# Patient Record
Sex: Female | Born: 1965
Health system: Southern US, Community
[De-identification: ages and names within clinical notes are randomized; demographics above are authoritative.]

## PROBLEM LIST (undated history)

## (undated) DIAGNOSIS — Z9109 Other allergy status, other than to drugs and biological substances: Secondary | ICD-10-CM

## (undated) DIAGNOSIS — M722 Plantar fascial fibromatosis: Secondary | ICD-10-CM

## (undated) DIAGNOSIS — T7840XA Allergy, unspecified, initial encounter: Secondary | ICD-10-CM

## (undated) DIAGNOSIS — J45909 Unspecified asthma, uncomplicated: Secondary | ICD-10-CM

## (undated) DIAGNOSIS — J302 Other seasonal allergic rhinitis: Secondary | ICD-10-CM

## (undated) HISTORY — PX: FOOT SURGERY: SHX648

## (undated) HISTORY — DX: Plantar fascial fibromatosis: M72.2

## (undated) HISTORY — DX: Allergy, unspecified, initial encounter: T78.40XA

## (undated) HISTORY — DX: Other seasonal allergic rhinitis: J30.2

## (undated) HISTORY — DX: Other allergy status, other than to drugs and biological substances: Z91.09

## (undated) HISTORY — PX: TONSILLECTOMY: SHX5217

## (undated) HISTORY — DX: Unspecified asthma, uncomplicated: J45.909

---

## 2011-04-27 ENCOUNTER — Emergency Department (HOSPITAL_COMMUNITY)
Admission: EM | Admit: 2011-04-27 | Discharge: 2011-04-28 | Disposition: A | Discharge status: Home / Self Care | Payer: Self-pay | Attending: Emergency Medicine | Admitting: Emergency Medicine

## 2011-04-27 DIAGNOSIS — J45909 Unspecified asthma, uncomplicated: Secondary | ICD-10-CM | POA: Insufficient documentation

## 2011-04-27 DIAGNOSIS — J329 Chronic sinusitis, unspecified: Secondary | ICD-10-CM | POA: Insufficient documentation

## 2011-04-27 DIAGNOSIS — J3489 Other specified disorders of nose and nasal sinuses: Secondary | ICD-10-CM | POA: Insufficient documentation

## 2011-05-10 ENCOUNTER — Inpatient Hospital Stay (INDEPENDENT_AMBULATORY_CARE_PROVIDER_SITE_OTHER)
Admission: RE | Admit: 2011-05-10 | Discharge: 2011-05-10 | Disposition: A | Discharge status: Home / Self Care | Payer: Self-pay | Source: Ambulatory Visit | Attending: Family Medicine | Admitting: Family Medicine

## 2011-05-10 DIAGNOSIS — J310 Chronic rhinitis: Secondary | ICD-10-CM

## 2011-05-10 DIAGNOSIS — J019 Acute sinusitis, unspecified: Secondary | ICD-10-CM

## 2013-12-10 LAB — HM PAP SMEAR: HM PAP: POSITIVE

## 2015-01-20 ENCOUNTER — Ambulatory Visit: Payer: 59

## 2015-02-25 ENCOUNTER — Telehealth: Payer: Self-pay | Admitting: *Deleted

## 2015-02-25 ENCOUNTER — Encounter: Payer: Self-pay | Admitting: *Deleted

## 2015-02-25 NOTE — Telephone Encounter (Signed)
Pre-Visit Call completed with patient and chart updated.   Pre-Visit Info documented in Specialty Comments under [employer].    

## 2015-02-25 NOTE — Telephone Encounter (Signed)
Patient returned phone call. °

## 2015-02-25 NOTE — Addendum Note (Signed)
Addended by: Noreene LarssonLARSON, Kyren Vaux A on: 02/25/2015 04:53 PM   Modules accepted: Medications

## 2015-02-25 NOTE — Telephone Encounter (Signed)
Unable to reach patient at time of Pre-Visit Call.  Left message for patient to return call when available.    

## 2015-02-26 ENCOUNTER — Ambulatory Visit (INDEPENDENT_AMBULATORY_CARE_PROVIDER_SITE_OTHER): Payer: 59 | Admitting: Physician Assistant

## 2015-02-26 ENCOUNTER — Encounter: Payer: Self-pay | Admitting: Physician Assistant

## 2015-02-26 VITALS — BP 116/57 | HR 54 | Temp 98.2°F | Resp 16 | Ht 60.0 in | Wt 127.4 lb

## 2015-02-26 DIAGNOSIS — M722 Plantar fascial fibromatosis: Secondary | ICD-10-CM | POA: Diagnosis not present

## 2015-02-26 DIAGNOSIS — J302 Other seasonal allergic rhinitis: Secondary | ICD-10-CM | POA: Diagnosis not present

## 2015-02-26 DIAGNOSIS — J4530 Mild persistent asthma, uncomplicated: Secondary | ICD-10-CM | POA: Diagnosis not present

## 2015-02-26 MED ORDER — MELOXICAM 15 MG PO TABS: 15.0000 mg | ORAL_TABLET | Freq: Every day | ORAL | Status: DC

## 2015-02-26 MED ORDER — BUDESONIDE-FORMOTEROL FUMARATE 80-4.5 MCG/ACT IN AERO: 2.0000 | INHALATION_SPRAY | Freq: Two times a day (BID) | RESPIRATORY_TRACT | Status: DC

## 2015-02-26 MED ORDER — AZELASTINE-FLUTICASONE 137-50 MCG/ACT NA SUSP: NASAL | Status: DC

## 2015-02-26 MED ORDER — MONTELUKAST SODIUM 10 MG PO TABS: 10.0000 mg | ORAL_TABLET | Freq: Every day | ORAL | Status: DC

## 2015-02-26 MED ORDER — ALBUTEROL SULFATE HFA 108 (90 BASE) MCG/ACT IN AERS: 2.0000 | INHALATION_SPRAY | Freq: Four times a day (QID) | RESPIRATORY_TRACT | Status: DC | PRN

## 2015-02-26 NOTE — Assessment & Plan Note (Signed)
Rx Singulair and Dymista.  Supportive measures discussed with patient.  Follow-up 1 month.

## 2015-02-26 NOTE — Progress Notes (Signed)
Pre visit review using our clinic review tool, if applicable. No additional management support is needed unless otherwise documented below in the visit note/SLS  

## 2015-02-26 NOTE — Assessment & Plan Note (Signed)
Referral placed to podiatry per patient request.  Agree this is best giving chronicity of symptoms. Rx Mobic with food. Supportive footwear.  Supportive measures reviewed.

## 2015-02-26 NOTE — Assessment & Plan Note (Signed)
Refilled Symbicort and Albuterol.  Will restart Singulair. Follow-up 1 month.

## 2015-02-26 NOTE — Patient Instructions (Signed)
Please continue your Symbicort and Albuterol as directed. Restart Singulair as directed. Stop the Flonase and start the Dymista nasal spray.  You will be contacted by Podiatry for assessment. Please use the Mobic daily with food. Wear supportive shoes.

## 2015-02-26 NOTE — Progress Notes (Signed)
Patient presents to clinic today to establish care.  Asthma -- Patient currently on Symbicort and Albuterol.  Is out of albuterol and endorses increased wheeze due to her allergies.  Denies chest pain, lightheadedness or dizziness.  Was previously on Singulair which she notes was very helpful.  Seasonal/Environmental Allergies -- Currently on Zyrtec and Flonase with little relief of symptoms.  Endorses rhinorrhea and watery eyes along with sneezing and chronic PND.  Plantar Fasciitis -- Long-standing history with prior surgery.  Endorses recent flare up with L foot worse than R.  Is requesting referral to podiatry.  Past Medical History  Diagnosis Date  . Asthma   . Allergy   . Environmental allergies   . Seasonal allergies   . Plantar fasciitis     Bilateral    Past Surgical History  Procedure Laterality Date  . Tonsillectomy    . Cesarean section    . Foot surgery      Right    Current Outpatient Prescriptions on File Prior to Visit  Medication Sig Dispense Refill  . cetirizine (ZYRTEC) 10 MG tablet Take 10 mg by mouth daily as needed.      No current facility-administered medications on file prior to visit.    No Known Allergies  Family History  Problem Relation Age of Onset  . Stroke Father 6089    Deceased  . Other Father     Borderline Diabetes  . Macular degeneration Mother     Living  . Vascular Disease Mother   . Lung cancer Maternal Uncle     #1  . Heart attack Maternal Uncle     Deceased at 2842, #2  . Skin cancer Maternal Uncle     #1  . HIV/AIDS Brother     #1  . Healthy Brother     #2  . Healthy Sister     x2  . Heart defect Son     Bicuspid Valve  . Healthy Son     #2  . Healthy Daughter     x1  . Allergies Son     #1    History   Social History  . Marital Status: Married    Spouse Name: N/A  . Number of Children: N/A  . Years of Education: N/A   Occupational History  . Not on file.   Social History Main Topics  . Smoking  status: Current Some Day Smoker -- 1.00 packs/day    Types: Cigarettes  . Smokeless tobacco: Never Used  . Alcohol Use: 4.8 oz/week    8 Standard drinks or equivalent per week  . Drug Use: No  . Sexual Activity: Yes   Other Topics Concern  . Not on file   Social History Narrative   Review of Systems  Constitutional: Negative for fever and weight loss.  Respiratory: Positive for wheezing. Negative for cough, hemoptysis, sputum production and shortness of breath.   Cardiovascular: Negative for chest pain and palpitations.  Musculoskeletal: Positive for joint pain.   BP 116/57 mmHg  Pulse 54  Temp(Src) 98.2 F (36.8 C) (Oral)  Resp 16  Ht 5' (1.524 m)  Wt 127 lb 6 oz (57.777 kg)  BMI 24.88 kg/m2  SpO2 97%  Physical Exam  Constitutional: She is oriented to person, place, and time and well-developed, well-nourished, and in no distress.  HENT:  Head: Normocephalic and atraumatic.  Right Ear: Tympanic membrane, external ear and ear canal normal.  Left Ear: Tympanic membrane, external ear and ear  canal normal.  Nose: Mucosal edema and rhinorrhea present.  Mouth/Throat: Uvula is midline, oropharynx is clear and moist and mucous membranes are normal.  Eyes: Conjunctivae are normal.  Neck: Neck supple.  Cardiovascular: Normal rate, regular rhythm, normal heart sounds and intact distal pulses.   Pulmonary/Chest: Effort normal and breath sounds normal. No respiratory distress. She has no wheezes. She has no rales. She exhibits no tenderness.  Lymphadenopathy:    She has no cervical adenopathy.  Neurological: She is alert and oriented to person, place, and time.  Skin: Skin is warm and dry. No rash noted.  Psychiatric: Affect normal.  Vitals reviewed.  Assessment/Plan: Seasonal allergies Rx Singulair and Dymista.  Supportive measures discussed with patient.  Follow-up 1 month.  Asthma, mild persistent Refilled Symbicort and Albuterol.  Will restart Singulair. Follow-up 1  month.  Plantar fasciitis, bilateral Referral placed to podiatry per patient request.  Agree this is best giving chronicity of symptoms. Rx Mobic with food. Supportive footwear.  Supportive measures reviewed.

## 2015-07-04 ENCOUNTER — Ambulatory Visit (INDEPENDENT_AMBULATORY_CARE_PROVIDER_SITE_OTHER): Payer: 59 | Admitting: Physician Assistant

## 2015-07-04 ENCOUNTER — Encounter: Payer: Self-pay | Admitting: Physician Assistant

## 2015-07-04 VITALS — BP 123/78 | HR 58 | Temp 98.1°F | Resp 16 | Ht 60.0 in | Wt 130.5 lb

## 2015-07-04 DIAGNOSIS — J4530 Mild persistent asthma, uncomplicated: Secondary | ICD-10-CM

## 2015-07-04 DIAGNOSIS — J019 Acute sinusitis, unspecified: Secondary | ICD-10-CM

## 2015-07-04 DIAGNOSIS — J302 Other seasonal allergic rhinitis: Secondary | ICD-10-CM | POA: Diagnosis not present

## 2015-07-04 DIAGNOSIS — M722 Plantar fascial fibromatosis: Secondary | ICD-10-CM | POA: Diagnosis not present

## 2015-07-04 DIAGNOSIS — B9689 Other specified bacterial agents as the cause of diseases classified elsewhere: Secondary | ICD-10-CM | POA: Insufficient documentation

## 2015-07-04 MED ORDER — AZELASTINE-FLUTICASONE 137-50 MCG/ACT NA SUSP: NASAL | Status: DC

## 2015-07-04 MED ORDER — BUDESONIDE-FORMOTEROL FUMARATE 80-4.5 MCG/ACT IN AERO: 2.0000 | INHALATION_SPRAY | Freq: Two times a day (BID) | RESPIRATORY_TRACT | Status: DC

## 2015-07-04 MED ORDER — AMOXICILLIN-POT CLAVULANATE 875-125 MG PO TABS: 1.0000 | ORAL_TABLET | Freq: Two times a day (BID) | ORAL | Status: DC

## 2015-07-04 MED ORDER — FEXOFENADINE HCL 180 MG PO TABS: 180.0000 mg | ORAL_TABLET | Freq: Every day | ORAL | Status: DC

## 2015-07-04 MED ORDER — ALBUTEROL SULFATE (2.5 MG/3ML) 0.083% IN NEBU: 2.5000 mg | INHALATION_SOLUTION | Freq: Four times a day (QID) | RESPIRATORY_TRACT | Status: DC | PRN

## 2015-07-04 MED ORDER — MELOXICAM 15 MG PO TABS: 15.0000 mg | ORAL_TABLET | Freq: Every day | ORAL | Status: DC

## 2015-07-04 MED ORDER — MONTELUKAST SODIUM 10 MG PO TABS: 10.0000 mg | ORAL_TABLET | Freq: Every day | ORAL | Status: DC

## 2015-07-04 NOTE — Progress Notes (Signed)
Patient presents to clinic today c/o feeling under the weather over the past couple of weeks. Endorses sinus pressure/sinus pain, tooth pain and fatigue. Endorses some mild PND and ST over the past week. Endorses chest tightness and wheezing 2 days ago but has resolved since that time. Is in need of refills of asthma medications. Denies recent travel or sick contact.  Past Medical History  Diagnosis Date  . Asthma   . Allergy   . Environmental allergies   . Seasonal allergies   . Plantar fasciitis     Bilateral    Current Outpatient Prescriptions on File Prior to Visit  Medication Sig Dispense Refill  . albuterol (PROVENTIL HFA;VENTOLIN HFA) 108 (90 BASE) MCG/ACT inhaler Inhale 2 puffs into the lungs every 6 (six) hours as needed for wheezing or shortness of breath. 6.7 g 3   No current facility-administered medications on file prior to visit.    No Known Allergies  Family History  Problem Relation Age of Onset  . Stroke Father 74    Deceased  . Other Father     Borderline Diabetes  . Macular degeneration Mother     Living  . Vascular Disease Mother   . Lung cancer Maternal Uncle     #1  . Heart attack Maternal Uncle     Deceased at 53, #2  . Skin cancer Maternal Uncle     #1  . HIV/AIDS Brother     #1  . Healthy Brother     #2  . Healthy Sister     x2  . Heart defect Son     Bicuspid Valve  . Healthy Son     #2  . Healthy Daughter     x1  . Allergies Son     #1    Social History   Social History  . Marital Status: Married    Spouse Name: N/A  . Number of Children: N/A  . Years of Education: N/A   Social History Main Topics  . Smoking status: Current Some Day Smoker -- 1.00 packs/day    Types: Cigarettes  . Smokeless tobacco: Never Used  . Alcohol Use: 4.8 oz/week    8 Standard drinks or equivalent per week  . Drug Use: No  . Sexual Activity: Yes   Other Topics Concern  . None   Social History Narrative   Review of Systems - See HPI.  All  other ROS are negative.  BP 123/78 mmHg  Pulse 58  Temp(Src) 98.1 F (36.7 C) (Oral)  Resp 16  Ht 5' (1.524 m)  Wt 130 lb 8 oz (59.194 kg)  BMI 25.49 kg/m2  SpO2 96%  Physical Exam  Constitutional: She is well-developed, well-nourished, and in no distress.  HENT:  Head: Normocephalic and atraumatic.  Right Ear: Tympanic membrane normal.  Left Ear: Tympanic membrane normal.  Nose: Right sinus exhibits maxillary sinus tenderness. Left sinus exhibits maxillary sinus tenderness.  Mouth/Throat: Uvula is midline, oropharynx is clear and moist and mucous membranes are normal.  Eyes: Conjunctivae are normal.  Neck: Neck supple.  Cardiovascular: Normal rate, regular rhythm, normal heart sounds and intact distal pulses.   Pulmonary/Chest: Effort normal and breath sounds normal. No respiratory distress. She has no wheezes. She has no rales. She exhibits no tenderness.  Neurological: She is alert.  Skin: Skin is warm and dry. No rash noted.  Psychiatric: Affect normal.  Vitals reviewed.   No results found for this or any previous visit (from the  past 2160 hour(s)).  Assessment/Plan: Acute bacterial sinusitis Rx Augmentin.  Increase fluids.  Rest.  Saline nasal spray.  Probiotic.  Mucinex as directed.  Humidifier in bedroom. Allergy/asthma medications refilled.  Call or return to clinic if symptoms are not improving.

## 2015-07-04 NOTE — Progress Notes (Signed)
Pre visit review using our clinic review tool, if applicable. No additional management support is needed unless otherwise documented below in the visit note/SLS  

## 2015-07-04 NOTE — Patient Instructions (Signed)
Please take antibiotic as directed.  Increase fluid intake.  Use Saline nasal spray.  Take a daily multivitamin. Please continue allergy medications.  Place a humidifier in the bedroom.  Please call or return clinic if symptoms are not improving.  Sinusitis Sinusitis is redness, soreness, and swelling (inflammation) of the paranasal sinuses. Paranasal sinuses are air pockets within the bones of your face (beneath the eyes, the middle of the forehead, or above the eyes). In healthy paranasal sinuses, mucus is able to drain out, and air is able to circulate through them by way of your nose. However, when your paranasal sinuses are inflamed, mucus and air can become trapped. This can allow bacteria and other germs to grow and cause infection. Sinusitis can develop quickly and last only a short time (acute) or continue over a long period (chronic). Sinusitis that lasts for more than 12 weeks is considered chronic.  CAUSES  Causes of sinusitis include:  Allergies.  Structural abnormalities, such as displacement of the cartilage that separates your nostrils (deviated septum), which can decrease the air flow through your nose and sinuses and affect sinus drainage.  Functional abnormalities, such as when the small hairs (cilia) that line your sinuses and help remove mucus do not work properly or are not present. SYMPTOMS  Symptoms of acute and chronic sinusitis are the same. The primary symptoms are pain and pressure around the affected sinuses. Other symptoms include:  Upper toothache.  Earache.  Headache.  Bad breath.  Decreased sense of smell and taste.  A cough, which worsens when you are lying flat.  Fatigue.  Fever.  Thick drainage from your nose, which often is green and may contain pus (purulent).  Swelling and warmth over the affected sinuses. DIAGNOSIS  Your caregiver will perform a physical exam. During the exam, your caregiver may:  Look in your nose for signs of abnormal  growths in your nostrils (nasal polyps).  Tap over the affected sinus to check for signs of infection.  View the inside of your sinuses (endoscopy) with a special imaging device with a light attached (endoscope), which is inserted into your sinuses. If your caregiver suspects that you have chronic sinusitis, one or more of the following tests may be recommended:  Allergy tests.  Nasal culture A sample of mucus is taken from your nose and sent to a lab and screened for bacteria.  Nasal cytology A sample of mucus is taken from your nose and examined by your caregiver to determine if your sinusitis is related to an allergy. TREATMENT  Most cases of acute sinusitis are related to a viral infection and will resolve on their own within 10 days. Sometimes medicines are prescribed to help relieve symptoms (pain medicine, decongestants, nasal steroid sprays, or saline sprays).  However, for sinusitis related to a bacterial infection, your caregiver will prescribe antibiotic medicines. These are medicines that will help kill the bacteria causing the infection.  Rarely, sinusitis is caused by a fungal infection. In theses cases, your caregiver will prescribe antifungal medicine. For some cases of chronic sinusitis, surgery is needed. Generally, these are cases in which sinusitis recurs more than 3 times per year, despite other treatments. HOME CARE INSTRUCTIONS   Drink plenty of water. Water helps thin the mucus so your sinuses can drain more easily.  Use a humidifier.  Inhale steam 3 to 4 times a day (for example, sit in the bathroom with the shower running).  Apply a warm, moist washcloth to your face 3 to  4 times a day, or as directed by your caregiver.  Use saline nasal sprays to help moisten and clean your sinuses.  Take over-the-counter or prescription medicines for pain, discomfort, or fever only as directed by your caregiver. SEEK IMMEDIATE MEDICAL CARE IF:  You have increasing pain or  severe headaches.  You have nausea, vomiting, or drowsiness.  You have swelling around your face.  You have vision problems.  You have a stiff neck.  You have difficulty breathing. MAKE SURE YOU:   Understand these instructions.  Will watch your condition.  Will get help right away if you are not doing well or get worse. Document Released: 09/06/2005 Document Revised: 11/29/2011 Document Reviewed: 09/21/2011 [facility] Patient Information 2014 [place], [place].

## 2015-07-04 NOTE — Assessment & Plan Note (Signed)
Rx Augmentin.  Increase fluids.  Rest.  Saline nasal spray.  Probiotic.  Mucinex as directed.  Humidifier in bedroom. Allergy/asthma medications refilled.  Call or return to clinic if symptoms are not improving.

## 2015-09-24 ENCOUNTER — Other Ambulatory Visit: Payer: Self-pay | Admitting: Physician Assistant

## 2015-09-24 NOTE — Telephone Encounter (Signed)
Refill sent per [facility] refill protocol/SLS  

## 2015-09-26 ENCOUNTER — Telehealth: Payer: Self-pay | Admitting: Physician Assistant

## 2015-09-26 ENCOUNTER — Ambulatory Visit: Payer: 59 | Admitting: Physician Assistant

## 2015-09-26 NOTE — Telephone Encounter (Signed)
Pt left VM 1/6 10:55am to cancel todays appt, no reason given, said she would call back to reschedule, charge or no charge?

## 2015-09-26 NOTE — Telephone Encounter (Signed)
No charge. 

## 2015-11-18 ENCOUNTER — Telehealth: Payer: Self-pay | Admitting: Physician Assistant

## 2015-11-18 NOTE — Telephone Encounter (Signed)
LM for pt to call and schedule flu shot or update records. °

## 2015-12-02 ENCOUNTER — Encounter: Payer: 59 | Admitting: Physician Assistant

## 2015-12-10 ENCOUNTER — Ambulatory Visit (INDEPENDENT_AMBULATORY_CARE_PROVIDER_SITE_OTHER): Payer: BLUE CROSS/BLUE SHIELD | Admitting: Physician Assistant

## 2015-12-10 ENCOUNTER — Encounter: Payer: Self-pay | Admitting: Physician Assistant

## 2015-12-10 VITALS — BP 140/90 | HR 60 | Temp 98.1°F | Ht 60.0 in | Wt 129.2 lb

## 2015-12-10 DIAGNOSIS — J019 Acute sinusitis, unspecified: Secondary | ICD-10-CM

## 2015-12-10 DIAGNOSIS — B9689 Other specified bacterial agents as the cause of diseases classified elsewhere: Secondary | ICD-10-CM

## 2015-12-10 MED ORDER — DOXYCYCLINE HYCLATE 100 MG PO CAPS: 100.0000 mg | ORAL_CAPSULE | Freq: Two times a day (BID) | ORAL | Status: DC

## 2015-12-10 NOTE — Progress Notes (Signed)
Patient presents to clinic today c/o 5 days of worsening sinus pressure, nasal congestion, low grade fever and sinus pain. Endorses chills and aches off an on. Endorses fever Sunday to Monday (Tmax 101). Denies ear pain or tooth pain. R sided sinus pain > L.   Past Medical History  Diagnosis Date  . Asthma   . Allergy   . Environmental allergies   . Seasonal allergies   . Plantar fasciitis     Bilateral    Current Outpatient Prescriptions on File Prior to Visit  Medication Sig Dispense Refill  . albuterol (PROVENTIL) (2.5 MG/3ML) 0.083% nebulizer solution Take 3 mLs (2.5 mg total) by nebulization every 6 (six) hours as needed for wheezing or shortness of breath. 75 mL 3  . budesonide-formoterol (SYMBICORT) 80-4.5 MCG/ACT inhaler Inhale 2 puffs into the lungs 2 (two) times daily. 1 Inhaler 5  . montelukast (SINGULAIR) 10 MG tablet Take 1 tablet (10 mg total) by mouth at bedtime. 30 tablet 3  . VENTOLIN HFA 108 (90 Base) MCG/ACT inhaler inhale 2 puffs INTO THE LUNGS every 6 hours if needed for wheezing or shortness of breath 18 Inhaler 3  . Azelastine-Fluticasone 137-50 MCG/ACT SUSP 1 spray into each nostril twice daily (Patient not taking: Reported on 12/10/2015) 23 g 3  . fexofenadine (ALLEGRA ALLERGY) 180 MG tablet Take 1 tablet (180 mg total) by mouth daily. (Patient not taking: Reported on 12/10/2015) 30 tablet 1   No current facility-administered medications on file prior to visit.    No Known Allergies  Family History  Problem Relation Age of Onset  . Stroke Father 38    Deceased  . Other Father     Borderline Diabetes  . Macular degeneration Mother     Living  . Vascular Disease Mother   . Lung cancer Maternal Uncle     #1  . Heart attack Maternal Uncle     Deceased at 41, #2  . Skin cancer Maternal Uncle     #1  . HIV/AIDS Brother     #1  . Healthy Brother     #2  . Healthy Sister     x2  . Heart defect Son     Bicuspid Valve  . Healthy Son     #2  .  Healthy Daughter     x1  . Allergies Son     #1    Social History   Social History  . Marital Status: Married    Spouse Name: N/A  . Number of Children: N/A  . Years of Education: N/A   Social History Main Topics  . Smoking status: Current Some Day Smoker -- 1.00 packs/day    Types: Cigarettes  . Smokeless tobacco: Never Used  . Alcohol Use: 4.8 oz/week    8 Standard drinks or equivalent per week  . Drug Use: No  . Sexual Activity: Yes   Other Topics Concern  . None   Social History Narrative   Review of Systems - See HPI.  All other ROS are negative.  BP 140/90 mmHg  Pulse 60  Temp(Src) 98.1 F (36.7 C) (Oral)  Ht 5' (1.524 m)  Wt 129 lb 3.2 oz (58.605 kg)  BMI 25.23 kg/m2  SpO2 95%  Physical Exam  Constitutional: She is oriented to person, place, and time and well-developed, well-nourished, and in no distress.  HENT:  Head: Normocephalic and atraumatic.  Right Ear: External ear normal.  Left Ear: External ear normal.  Mouth/Throat: Oropharynx  is clear and moist.  + TTP sinuses  Eyes: Conjunctivae are normal.  Neck: Neck supple.  Cardiovascular: Normal rate, regular rhythm, normal heart sounds and intact distal pulses.   Pulmonary/Chest: Effort normal and breath sounds normal. No respiratory distress. She has no wheezes. She has no rales. She exhibits no tenderness.  Neurological: She is alert and oriented to person, place, and time.  Skin: Skin is warm and dry. No rash noted.  Psychiatric: Affect normal.  Vitals reviewed.   No results found for this or any previous visit (from the past 2160 hour(s)).  Assessment/Plan: Acute bacterial sinusitis Rx Doxycycline.  Increase fluids.  Rest.  Saline nasal spray.  Probiotic.  Mucinex as directed.  Humidifier in bedroom. Supportive measures reviewed.  Call or return to clinic if symptoms are not improving.

## 2015-12-10 NOTE — Patient Instructions (Signed)
Please take antibiotic as directed.  Increase fluid intake.  Use Saline nasal spray.  Take a daily multivitamin. Resume allergy sprays. Delsym for cough.  Place a humidifier in the bedroom.  Please call or return clinic if symptoms are not improving.  Sinusitis Sinusitis is redness, soreness, and swelling (inflammation) of the paranasal sinuses. Paranasal sinuses are air pockets within the bones of your face (beneath the eyes, the middle of the forehead, or above the eyes). In healthy paranasal sinuses, mucus is able to drain out, and air is able to circulate through them by way of your nose. However, when your paranasal sinuses are inflamed, mucus and air can become trapped. This can allow bacteria and other germs to grow and cause infection. Sinusitis can develop quickly and last only a short time (acute) or continue over a long period (chronic). Sinusitis that lasts for more than 12 weeks is considered chronic.  CAUSES  Causes of sinusitis include:  Allergies.  Structural abnormalities, such as displacement of the cartilage that separates your nostrils (deviated septum), which can decrease the air flow through your nose and sinuses and affect sinus drainage.  Functional abnormalities, such as when the small hairs (cilia) that line your sinuses and help remove mucus do not work properly or are not present. SYMPTOMS  Symptoms of acute and chronic sinusitis are the same. The primary symptoms are pain and pressure around the affected sinuses. Other symptoms include:  Upper toothache.  Earache.  Headache.  Bad breath.  Decreased sense of smell and taste.  A cough, which worsens when you are lying flat.  Fatigue.  Fever.  Thick drainage from your nose, which often is green and may contain pus (purulent).  Swelling and warmth over the affected sinuses. DIAGNOSIS  Your caregiver will perform a physical exam. During the exam, your caregiver may:  Look in your nose for signs of  abnormal growths in your nostrils (nasal polyps).  Tap over the affected sinus to check for signs of infection.  View the inside of your sinuses (endoscopy) with a special imaging device with a light attached (endoscope), which is inserted into your sinuses. If your caregiver suspects that you have chronic sinusitis, one or more of the following tests may be recommended:  Allergy tests.  Nasal culture A sample of mucus is taken from your nose and sent to a lab and screened for bacteria.  Nasal cytology A sample of mucus is taken from your nose and examined by your caregiver to determine if your sinusitis is related to an allergy. TREATMENT  Most cases of acute sinusitis are related to a viral infection and will resolve on their own within 10 days. Sometimes medicines are prescribed to help relieve symptoms (pain medicine, decongestants, nasal steroid sprays, or saline sprays).  However, for sinusitis related to a bacterial infection, your caregiver will prescribe antibiotic medicines. These are medicines that will help kill the bacteria causing the infection.  Rarely, sinusitis is caused by a fungal infection. In theses cases, your caregiver will prescribe antifungal medicine. For some cases of chronic sinusitis, surgery is needed. Generally, these are cases in which sinusitis recurs more than 3 times per year, despite other treatments. HOME CARE INSTRUCTIONS   Drink plenty of water. Water helps thin the mucus so your sinuses can drain more easily.  Use a humidifier.  Inhale steam 3 to 4 times a day (for example, sit in the bathroom with the shower running).  Apply a warm, moist washcloth to your face  3 to 4 times a day, or as directed by your caregiver.  Use saline nasal sprays to help moisten and clean your sinuses.  Take over-the-counter or prescription medicines for pain, discomfort, or fever only as directed by your caregiver. SEEK IMMEDIATE MEDICAL CARE IF:  You have increasing  pain or severe headaches.  You have nausea, vomiting, or drowsiness.  You have swelling around your face.  You have vision problems.  You have a stiff neck.  You have difficulty breathing. MAKE SURE YOU:   Understand these instructions.  Will watch your condition.  Will get help right away if you are not doing well or get worse. Document Released: 09/06/2005 Document Revised: 11/29/2011 Document Reviewed: 09/21/2011 [facility] Patient Information 2014 [place], [place].

## 2015-12-10 NOTE — Progress Notes (Signed)
Pre visit review using our clinic review tool, if applicable. No additional management support is needed unless otherwise documented below in the visit note. 

## 2015-12-11 NOTE — Assessment & Plan Note (Signed)
Rx Doxycycline.  Increase fluids.  Rest.  Saline nasal spray.  Probiotic.  Mucinex as directed.  Humidifier in bedroom. Supportive measures reviewed.  Call or return to clinic if symptoms are not improving.

## 2015-12-22 ENCOUNTER — Ambulatory Visit (INDEPENDENT_AMBULATORY_CARE_PROVIDER_SITE_OTHER): Payer: BLUE CROSS/BLUE SHIELD | Admitting: Physician Assistant

## 2015-12-22 ENCOUNTER — Encounter: Payer: Self-pay | Admitting: Physician Assistant

## 2015-12-22 VITALS — BP 130/80 | HR 58 | Temp 97.9°F | Ht 60.0 in | Wt 129.4 lb

## 2015-12-22 DIAGNOSIS — Z0000 Encounter for general adult medical examination without abnormal findings: Secondary | ICD-10-CM | POA: Insufficient documentation

## 2015-12-22 DIAGNOSIS — J019 Acute sinusitis, unspecified: Secondary | ICD-10-CM

## 2015-12-22 DIAGNOSIS — Z23 Encounter for immunization: Secondary | ICD-10-CM | POA: Diagnosis not present

## 2015-12-22 DIAGNOSIS — F17200 Nicotine dependence, unspecified, uncomplicated: Secondary | ICD-10-CM | POA: Diagnosis not present

## 2015-12-22 DIAGNOSIS — Z1239 Encounter for other screening for malignant neoplasm of breast: Secondary | ICD-10-CM

## 2015-12-22 DIAGNOSIS — Z0001 Encounter for general adult medical examination with abnormal findings: Secondary | ICD-10-CM | POA: Diagnosis not present

## 2015-12-22 DIAGNOSIS — Z1211 Encounter for screening for malignant neoplasm of colon: Secondary | ICD-10-CM | POA: Diagnosis not present

## 2015-12-22 DIAGNOSIS — B9689 Other specified bacterial agents as the cause of diseases classified elsewhere: Secondary | ICD-10-CM

## 2015-12-22 LAB — CBC
HCT: 38.3 % (ref 36.0–46.0)
Hemoglobin: 13.3 g/dL (ref 12.0–15.0)
MCHC: 34.6 g/dL (ref 30.0–36.0)
MCV: 92.3 fl (ref 78.0–100.0)
Platelets: 647 10*3/uL — ABNORMAL HIGH (ref 150.0–400.0)
RBC: 4.16 Mil/uL (ref 3.87–5.11)
RDW: 12.8 % (ref 11.5–15.5)
WBC: 11.2 10*3/uL — AB (ref 4.0–10.5)

## 2015-12-22 LAB — COMPREHENSIVE METABOLIC PANEL
ALBUMIN: 4.1 g/dL (ref 3.5–5.2)
ALT: 11 U/L (ref 0–35)
AST: 17 U/L (ref 0–37)
Alkaline Phosphatase: 76 U/L (ref 39–117)
BUN: 13 mg/dL (ref 6–23)
CHLORIDE: 100 meq/L (ref 96–112)
CO2: 31 mEq/L (ref 19–32)
CREATININE: 0.58 mg/dL (ref 0.40–1.20)
Calcium: 9.6 mg/dL (ref 8.4–10.5)
GFR: 116.51 mL/min (ref >60.00)
GLUCOSE: 98 mg/dL (ref 70–99)
Potassium: 4 mEq/L (ref 3.5–5.1)
SODIUM: 138 meq/L (ref 135–145)
Total Bilirubin: 0.6 mg/dL (ref 0.2–1.2)
Total Protein: 7.7 g/dL (ref 6.0–8.3)

## 2015-12-22 LAB — LIPID PANEL
CHOLESTEROL: 170 mg/dL (ref 0–200)
HDL: 69.2 mg/dL (ref >39.00)
LDL Cholesterol: 89 mg/dL (ref 0–99)
NonHDL: 101.08
Total CHOL/HDL Ratio: 2
Triglycerides: 58 mg/dL (ref 0.0–149.0)
VLDL: 11.6 mg/dL (ref 0.0–40.0)

## 2015-12-22 LAB — TSH: TSH: 1.28 u[IU]/mL (ref 0.35–4.50)

## 2015-12-22 LAB — HEMOGLOBIN A1C: HEMOGLOBIN A1C: 5.8 % (ref 4.6–6.5)

## 2015-12-22 MED ORDER — AZITHROMYCIN 250 MG PO TABS: ORAL_TABLET | ORAL | Status: DC

## 2015-12-22 NOTE — Assessment & Plan Note (Signed)
Is down to 1 cigarette per day. Next goal is complete cessation. Patient wishes to continue doing on her own.

## 2015-12-22 NOTE — Assessment & Plan Note (Signed)
Declines Colonoscopy. Is wanting to proceed with Cologuard. She is to call insurance to verify coverage before we send in an order.

## 2015-12-22 NOTE — Assessment & Plan Note (Signed)
Rx Azithromycin. IM Depomedrol given. Continue allergy medications as directed

## 2015-12-22 NOTE — Patient Instructions (Signed)
Please go to the lab for blood work.  I will call you with your results. If your blood work is normal we will follow-up yearly for physicals.    Please continue chronic medications as directed. Take Azithromycin as directed. The steroid shot should help with sinus pressure. Call if symptoms are not resolving.  You will be contacted to schedule a mammogram. Please call insurance to see if they will cover Cologuard for you. If so, call me and I will send in an order.  Preventive Care for Adults, Female A healthy lifestyle and preventive care can promote health and wellness. Preventive health guidelines for women include the following key practices.  A routine yearly physical is a good way to check with your health care provider about your health and preventive screening. It is a chance to share any concerns and updates on your health and to receive a thorough exam.  Visit your dentist for a routine exam and preventive care every 6 months. Brush your teeth twice a day and floss once a day. Good oral hygiene prevents tooth decay and gum disease.  The frequency of eye exams is based on your age, health, family medical history, use of contact lenses, and other factors. Follow your health care provider's recommendations for frequency of eye exams.  Eat a healthy diet. Foods like vegetables, fruits, whole grains, low-fat dairy products, and lean protein foods contain the nutrients you need without too many calories. Decrease your intake of foods high in solid fats, added sugars, and salt. Eat the right amount of calories for you.Get information about a proper diet from your health care provider, if necessary.  Regular physical exercise is one of the most important things you can do for your health. Most adults should get at least 150 minutes of moderate-intensity exercise (any activity that increases your heart rate and causes you to sweat) each week. In addition, most adults need muscle-strengthening  exercises on 2 or more days a week.  Maintain a healthy weight. The body mass index (BMI) is a screening tool to identify possible weight problems. It provides an estimate of body fat based on height and weight. Your health care provider can find your BMI and can help you achieve or maintain a healthy weight.For adults 20 years and older:  A BMI below 18.5 is considered underweight.  A BMI of 18.5 to 24.9 is normal.  A BMI of 25 to 29.9 is considered overweight.  A BMI of 30 and above is considered obese.  Maintain normal blood lipids and cholesterol levels by exercising and minimizing your intake of saturated fat. Eat a balanced diet with plenty of fruit and vegetables. Blood tests for lipids and cholesterol should begin at age 46 and be repeated every 5 years. If your lipid or cholesterol levels are high, you are over 50, or you are at high risk for heart disease, you may need your cholesterol levels checked more frequently.Ongoing high lipid and cholesterol levels should be treated with medicines if diet and exercise are not working.  If you smoke, find out from your health care provider how to quit. If you do not use tobacco, do not start.  Lung cancer screening is recommended for adults aged 68-80 years who are at high risk for developing lung cancer because of a history of smoking. A yearly low-dose CT scan of the lungs is recommended for people who have at least a 30-pack-year history of smoking and are a current smoker or have quit within  the past 15 years. A pack year of smoking is smoking an average of 1 pack of cigarettes a day for 1 year (for example: 1 pack a day for 30 years or 2 packs a day for 15 years). Yearly screening should continue until the smoker has stopped smoking for at least 15 years. Yearly screening should be stopped for people who develop a health problem that would prevent them from having lung cancer treatment.  If you are pregnant, do not drink alcohol. If you are  breastfeeding, be very cautious about drinking alcohol. If you are not pregnant and choose to drink alcohol, do not have more than 1 drink per day. One drink is considered to be 12 ounces (355 mL) of beer, 5 ounces (148 mL) of wine, or 1.5 ounces (44 mL) of liquor.  Avoid use of street drugs. Do not share needles with anyone. Ask for help if you need support or instructions about stopping the use of drugs.  High blood pressure causes heart disease and increases the risk of stroke. Your blood pressure should be checked at least every 1 to 2 years. Ongoing high blood pressure should be treated with medicines if weight loss and exercise do not work.  If you are 60-71 years old, ask your health care provider if you should take aspirin to prevent strokes.  Diabetes screening is done by taking a blood sample to check your blood glucose level after you have not eaten for a certain period of time (fasting). If you are not overweight and you do not have risk factors for diabetes, you should be screened once every 3 years starting at age 59. If you are overweight or obese and you are 42-58 years of age, you should be screened for diabetes every year as part of your cardiovascular risk assessment.  Breast cancer screening is essential preventive care for women. You should practice "breast self-awareness." This means understanding the normal appearance and feel of your breasts and may include breast self-examination. Any changes detected, no matter how small, should be reported to a health care provider. Women in their 94s and 30s should have a clinical breast exam (CBE) by a health care provider as part of a regular health exam every 1 to 3 years. After age 40, women should have a CBE every year. Starting at age 65, women should consider having a mammogram (breast X-ray test) every year. Women who have a family history of breast cancer should talk to their health care provider about genetic screening. Women at a high  risk of breast cancer should talk to their health care providers about having an MRI and a mammogram every year.  Breast cancer gene (BRCA)-related cancer risk assessment is recommended for women who have family members with BRCA-related cancers. BRCA-related cancers include breast, ovarian, tubal, and peritoneal cancers. Having family members with these cancers may be associated with an increased risk for harmful changes (mutations) in the breast cancer genes BRCA1 and BRCA2. Results of the assessment will determine the need for genetic counseling and BRCA1 and BRCA2 testing.  Your health care provider may recommend that you be screened regularly for cancer of the pelvic organs (ovaries, uterus, and vagina). This screening involves a pelvic examination, including checking for microscopic changes to the surface of your cervix (Pap test). You may be encouraged to have this screening done every 3 years, beginning at age 26.  For women ages 66-65, health care providers may recommend pelvic exams and Pap testing every 3  years, or they may recommend the Pap and pelvic exam, combined with testing for human papilloma virus (HPV), every 5 years. Some types of HPV increase your risk of cervical cancer. Testing for HPV may also be done on women of any age with unclear Pap test results.  Other health care providers may not recommend any screening for nonpregnant women who are considered low risk for pelvic cancer and who do not have symptoms. Ask your health care provider if a screening pelvic exam is right for you.  If you have had past treatment for cervical cancer or a condition that could lead to cancer, you need Pap tests and screening for cancer for at least 20 years after your treatment. If Pap tests have been discontinued, your risk factors (such as having a new sexual partner) need to be reassessed to determine if screening should resume. Some women have medical problems that increase the chance of getting  cervical cancer. In these cases, your health care provider may recommend more frequent screening and Pap tests.  Colorectal cancer can be detected and often prevented. Most routine colorectal cancer screening begins at the age of 63 years and continues through age 31 years. However, your health care provider may recommend screening at an earlier age if you have risk factors for colon cancer. On a yearly basis, your health care provider may provide home test kits to check for hidden blood in the stool. Use of a small camera at the end of a tube, to directly examine the colon (sigmoidoscopy or colonoscopy), can detect the earliest forms of colorectal cancer. Talk to your health care provider about this at age 100, when routine screening begins. Direct exam of the colon should be repeated every 5-10 years through age 84 years, unless early forms of precancerous polyps or small growths are found.  People who are at an increased risk for hepatitis B should be screened for this virus. You are considered at high risk for hepatitis B if:  You were born in a country where hepatitis B occurs often. Talk with your health care provider about which countries are considered high risk.  Your parents were born in a high-risk country and you have not received a shot to protect against hepatitis B (hepatitis B vaccine).  You have HIV or AIDS.  You use needles to inject street drugs.  You live with, or have sex with, someone who has hepatitis B.  You get hemodialysis treatment.  You take certain medicines for conditions like cancer, organ transplantation, and autoimmune conditions.  Hepatitis C blood testing is recommended for all people born from [birth year] through 1965 and any individual with known risks for hepatitis C.  Practice safe sex. Use condoms and avoid high-risk sexual practices to reduce the spread of sexually transmitted infections (STIs). STIs include gonorrhea, chlamydia, syphilis, trichomonas, herpes,  HPV, and human immunodeficiency virus (HIV). Herpes, HIV, and HPV are viral illnesses that have no cure. They can result in disability, cancer, and death.  You should be screened for sexually transmitted illnesses (STIs) including gonorrhea and chlamydia if:  You are sexually active and are younger than 24 years.  You are older than 24 years and your health care provider tells you that you are at risk for this type of infection.  Your sexual activity has changed since you were last screened and you are at an increased risk for chlamydia or gonorrhea. Ask your health care provider if you are at risk.  If you are at  risk of being infected with HIV, it is recommended that you take a prescription medicine daily to prevent HIV infection. This is called preexposure prophylaxis (PrEP). You are considered at risk if:  You are sexually active and do not regularly use condoms or know the HIV status of your partner(s).  You take drugs by injection.  You are sexually active with a partner who has HIV.  Talk with your health care provider about whether you are at high risk of being infected with HIV. If you choose to begin PrEP, you should first be tested for HIV. You should then be tested every 3 months for as long as you are taking PrEP.  Osteoporosis is a disease in which the bones lose minerals and strength with aging. This can result in serious bone fractures or breaks. The risk of osteoporosis can be identified using a bone density scan. Women ages 63 years and over and women at risk for fractures or osteoporosis should discuss screening with their health care providers. Ask your health care provider whether you should take a calcium supplement or vitamin D to reduce the rate of osteoporosis.  Menopause can be associated with physical symptoms and risks. Hormone replacement therapy is available to decrease symptoms and risks. You should talk to your health care provider about whether hormone  replacement therapy is right for you.  Use sunscreen. Apply sunscreen liberally and repeatedly throughout the day. You should seek shade when your shadow is shorter than you. Protect yourself by wearing long sleeves, pants, a wide-brimmed hat, and sunglasses year round, whenever you are outdoors.  Once a month, do a whole body skin exam, using a mirror to look at the skin on your back. Tell your health care provider of new moles, moles that have irregular borders, moles that are larger than a pencil eraser, or moles that have changed in shape or color.  Stay current with required vaccines (immunizations).  Influenza vaccine. All adults should be immunized every year.  Tetanus, diphtheria, and acellular pertussis (Td, Tdap) vaccine. Pregnant women should receive 1 dose of Tdap vaccine during each pregnancy. The dose should be obtained regardless of the length of time since the last dose. Immunization is preferred during the 27th-36th week of gestation. An adult who has not previously received Tdap or who does not know her vaccine status should receive 1 dose of Tdap. This initial dose should be followed by tetanus and diphtheria toxoids (Td) booster doses every 10 years. Adults with an unknown or incomplete history of completing a 3-dose immunization series with Td-containing vaccines should begin or complete a primary immunization series including a Tdap dose. Adults should receive a Td booster every 10 years.  Varicella vaccine. An adult without evidence of immunity to varicella should receive 2 doses or a second dose if she has previously received 1 dose. Pregnant females who do not have evidence of immunity should receive the first dose after pregnancy. This first dose should be obtained before leaving the health care facility. The second dose should be obtained 4-8 weeks after the first dose.  Human papillomavirus (HPV) vaccine. Females aged 13-26 years who have not received the vaccine previously  should obtain the 3-dose series. The vaccine is not recommended for use in pregnant females. However, pregnancy testing is not needed before receiving a dose. If a female is found to be pregnant after receiving a dose, no treatment is needed. In that case, the remaining doses should be delayed until after the pregnancy. Immunization  is recommended for any person with an immunocompromised condition through the age of 43 years if she did not get any or all doses earlier. During the 3-dose series, the second dose should be obtained 4-8 weeks after the first dose. The third dose should be obtained 24 weeks after the first dose and 16 weeks after the second dose.  Zoster vaccine. One dose is recommended for adults aged 37 years or older unless certain conditions are present.  Measles, mumps, and rubella (MMR) vaccine. Adults born before [birth year] generally are considered immune to measles and mumps. Adults born in [birth year] or later should have 1 or more doses of MMR vaccine unless there is a contraindication to the vaccine or there is laboratory evidence of immunity to each of the three diseases. A routine second dose of MMR vaccine should be obtained at least 28 days after the first dose for students attending postsecondary schools, health care workers, or international travelers. People who received inactivated measles vaccine or an unknown type of measles vaccine during 1963-1967 should receive 2 doses of MMR vaccine. People who received inactivated mumps vaccine or an unknown type of mumps vaccine before 1979 and are at high risk for mumps infection should consider immunization with 2 doses of MMR vaccine. For females of childbearing age, rubella immunity should be determined. If there is no evidence of immunity, females who are not pregnant should be vaccinated. If there is no evidence of immunity, females who are pregnant should delay immunization until after pregnancy. Unvaccinated health care workers born before [birth year]  who lack laboratory evidence of measles, mumps, or rubella immunity or laboratory confirmation of disease should consider measles and mumps immunization with 2 doses of MMR vaccine or rubella immunization with 1 dose of MMR vaccine.  Pneumococcal 13-valent conjugate (PCV13) vaccine. When indicated, a person who is uncertain of his immunization history and has no record of immunization should receive the PCV13 vaccine. All adults 64 years of age and older should receive this vaccine. An adult aged 22 years or older who has certain medical conditions and has not been previously immunized should receive 1 dose of PCV13 vaccine. This PCV13 should be followed with a dose of pneumococcal polysaccharide (PPSV23) vaccine. Adults who are at high risk for pneumococcal disease should obtain the PPSV23 vaccine at least 8 weeks after the dose of PCV13 vaccine. Adults older than 51 years of age who have normal immune system function should obtain the PPSV23 vaccine dose at least 1 year after the dose of PCV13 vaccine.  Pneumococcal polysaccharide (PPSV23) vaccine. When PCV13 is also indicated, PCV13 should be obtained first. All adults aged 40 years and older should be immunized. An adult younger than age 22 years who has certain medical conditions should be immunized. Any person who resides in a nursing home or long-term care facility should be immunized. An adult smoker should be immunized. People with an immunocompromised condition and certain other conditions should receive both PCV13 and PPSV23 vaccines. People with human immunodeficiency virus (HIV) infection should be immunized as soon as possible after diagnosis. Immunization during chemotherapy or radiation therapy should be avoided. Routine use of PPSV23 vaccine is not recommended for American Indians, [place] Natives, or people younger than 65 years unless there are medical conditions that require PPSV23 vaccine. When indicated, people who have unknown immunization  and have no record of immunization should receive PPSV23 vaccine. One-time revaccination 5 years after the first dose of PPSV23 is recommended for people aged 19-64 years who  have chronic kidney failure, nephrotic syndrome, asplenia, or immunocompromised conditions. People who received 1-2 doses of PPSV23 before age 35 years should receive another dose of PPSV23 vaccine at age 17 years or later if at least 5 years have passed since the previous dose. Doses of PPSV23 are not needed for people immunized with PPSV23 at or after age 64 years.  Meningococcal vaccine. Adults with asplenia or persistent complement component deficiencies should receive 2 doses of quadrivalent meningococcal conjugate (MenACWY-D) vaccine. The doses should be obtained at least 2 months apart. Microbiologists working with certain meningococcal bacteria, [employer] recruits, people at risk during an outbreak, and people who travel to or live in countries with a high rate of meningitis should be immunized. A first-year college student up through age 59 years who is living in a residence hall should receive a dose if she did not receive a dose on or after her 16th birthday. Adults who have certain high-risk conditions should receive one or more doses of vaccine.  Hepatitis A vaccine. Adults who wish to be protected from this disease, have certain high-risk conditions, work with hepatitis A-infected animals, work in hepatitis A research labs, or travel to or work in countries with a high rate of hepatitis A should be immunized. Adults who were previously unvaccinated and who anticipate close contact with an international adoptee during the first 60 days after arrival in the [place] from a country with a high rate of hepatitis A should be immunized.  Hepatitis B vaccine. Adults who wish to be protected from this disease, have certain high-risk conditions, may be exposed to blood or other infectious body fluids, are household contacts or  sex partners of hepatitis B positive people, are clients or workers in certain care facilities, or travel to or work in countries with a high rate of hepatitis B should be immunized.  Haemophilus influenzae type b (Hib) vaccine. A previously unvaccinated person with asplenia or sickle cell disease or having a scheduled splenectomy should receive 1 dose of Hib vaccine. Regardless of previous immunization, a recipient of a hematopoietic stem cell transplant should receive a 3-dose series 6-12 months after her successful transplant. Hib vaccine is not recommended for adults with HIV infection. Preventive Services / Frequency Ages 59 to 41 years  Blood pressure check.** / Every 3-5 years.  Lipid and cholesterol check.** / Every 5 years beginning at age 43.  Clinical breast exam.** / Every 3 years for women in their 28s and 67s.  BRCA-related cancer risk assessment.** / For women who have family members with a BRCA-related cancer (breast, ovarian, tubal, or peritoneal cancers).  Pap test.** / Every 2 years from ages 48 through 74. Every 3 years starting at age 71 through age 54 or 85 with a history of 3 consecutive normal Pap tests.  HPV screening.** / Every 3 years from ages 71 through ages 10 to 62 with a history of 3 consecutive normal Pap tests.  Hepatitis C blood test.** / For any individual with known risks for hepatitis C.  Skin self-exam. / Monthly.  Influenza vaccine. / Every year.  Tetanus, diphtheria, and acellular pertussis (Tdap, Td) vaccine.** / Consult your health care provider. Pregnant women should receive 1 dose of Tdap vaccine during each pregnancy. 1 dose of Td every 10 years.  Varicella vaccine.** / Consult your health care provider. Pregnant females who do not have evidence of immunity should receive the first dose after pregnancy.  HPV vaccine. / 3 doses over 6 months, if  32 and younger. The vaccine is not recommended for use in pregnant females. However, pregnancy  testing is not needed before receiving a dose.  Measles, mumps, rubella (MMR) vaccine.** / You need at least 1 dose of MMR if you were born in 1957 or later. You may also need a 2nd dose. For females of childbearing age, rubella immunity should be determined. If there is no evidence of immunity, females who are not pregnant should be vaccinated. If there is no evidence of immunity, females who are pregnant should delay immunization until after pregnancy.  Pneumococcal 13-valent conjugate (PCV13) vaccine.** / Consult your health care provider.  Pneumococcal polysaccharide (PPSV23) vaccine.** / 1 to 2 doses if you smoke cigarettes or if you have certain conditions.  Meningococcal vaccine.** / 1 dose if you are age 55 to 23 years and a Market researcher living in a residence hall, or have one of several medical conditions, you need to get vaccinated against meningococcal disease. You may also need additional booster doses.  Hepatitis A vaccine.** / Consult your health care provider.  Hepatitis B vaccine.** / Consult your health care provider.  Haemophilus influenzae type b (Hib) vaccine.** / Consult your health care provider. Ages 26 to 2 years  Blood pressure check.** / Every year.  Lipid and cholesterol check.** / Every 5 years beginning at age 26 years.  Lung cancer screening. / Every year if you are aged 21-80 years and have a 30-pack-year history of smoking and currently smoke or have quit within the past 15 years. Yearly screening is stopped once you have quit smoking for at least 15 years or develop a health problem that would prevent you from having lung cancer treatment.  Clinical breast exam.** / Every year after age 110 years.  BRCA-related cancer risk assessment.** / For women who have family members with a BRCA-related cancer (breast, ovarian, tubal, or peritoneal cancers).  Mammogram.** / Every year beginning at age 2 years and continuing for as long as you are in  good health. Consult with your health care provider.  Pap test.** / Every 3 years starting at age 46 years through age 33 or 70 years with a history of 3 consecutive normal Pap tests.  HPV screening.** / Every 3 years from ages 46 years through ages 49 to 61 years with a history of 3 consecutive normal Pap tests.  Fecal occult blood test (FOBT) of stool. / Every year beginning at age 18 years and continuing until age 46 years. You may not need to do this test if you get a colonoscopy every 10 years.  Flexible sigmoidoscopy or colonoscopy.** / Every 5 years for a flexible sigmoidoscopy or every 10 years for a colonoscopy beginning at age 25 years and continuing until age 72 years.  Hepatitis C blood test.** / For all people born from [birth year] through 1966 and any individual with known risks for hepatitis C.  Skin self-exam. / Monthly.  Influenza vaccine. / Every year.  Tetanus, diphtheria, and acellular pertussis (Tdap/Td) vaccine.** / Consult your health care provider. Pregnant women should receive 1 dose of Tdap vaccine during each pregnancy. 1 dose of Td every 10 years.  Varicella vaccine.** / Consult your health care provider. Pregnant females who do not have evidence of immunity should receive the first dose after pregnancy.  Zoster vaccine.** / 1 dose for adults aged 53 years or older.  Measles, mumps, rubella (MMR) vaccine.** / You need at least 1 dose of MMR if you were born in  1958 or later. You may also need a second dose. For females of childbearing age, rubella immunity should be determined. If there is no evidence of immunity, females who are not pregnant should be vaccinated. If there is no evidence of immunity, females who are pregnant should delay immunization until after pregnancy.  Pneumococcal 13-valent conjugate (PCV13) vaccine.** / Consult your health care provider.  Pneumococcal polysaccharide (PPSV23) vaccine.** / 1 to 2 doses if you smoke cigarettes or if you have certain  conditions.  Meningococcal vaccine.** / Consult your health care provider.  Hepatitis A vaccine.** / Consult your health care provider.  Hepatitis B vaccine.** / Consult your health care provider.  Haemophilus influenzae type b (Hib) vaccine.** / Consult your health care provider. Ages 39 years and over  Blood pressure check.** / Every year.  Lipid and cholesterol check.** / Every 5 years beginning at age 31 years.  Lung cancer screening. / Every year if you are aged 64-80 years and have a 30-pack-year history of smoking and currently smoke or have quit within the past 15 years. Yearly screening is stopped once you have quit smoking for at least 15 years or develop a health problem that would prevent you from having lung cancer treatment.  Clinical breast exam.** / Every year after age 77 years.  BRCA-related cancer risk assessment.** / For women who have family members with a BRCA-related cancer (breast, ovarian, tubal, or peritoneal cancers).  Mammogram.** / Every year beginning at age 28 years and continuing for as long as you are in good health. Consult with your health care provider.  Pap test.** / Every 3 years starting at age 73 years through age 37 or 47 years with 3 consecutive normal Pap tests. Testing can be stopped between 65 and 70 years with 3 consecutive normal Pap tests and no abnormal Pap or HPV tests in the past 10 years.  HPV screening.** / Every 3 years from ages 25 years through ages 80 or 16 years with a history of 3 consecutive normal Pap tests. Testing can be stopped between 65 and 70 years with 3 consecutive normal Pap tests and no abnormal Pap or HPV tests in the past 10 years.  Fecal occult blood test (FOBT) of stool. / Every year beginning at age 13 years and continuing until age 36 years. You may not need to do this test if you get a colonoscopy every 10 years.  Flexible sigmoidoscopy or colonoscopy.** / Every 5 years for a flexible sigmoidoscopy or every 10  years for a colonoscopy beginning at age 20 years and continuing until age 85 years.  Hepatitis C blood test.** / For all people born from [birth year] through 1966 and any individual with known risks for hepatitis C.  Osteoporosis screening.** / A one-time screening for women ages 15 years and over and women at risk for fractures or osteoporosis.  Skin self-exam. / Monthly.  Influenza vaccine. / Every year.  Tetanus, diphtheria, and acellular pertussis (Tdap/Td) vaccine.** / 1 dose of Td every 10 years.  Varicella vaccine.** / Consult your health care provider.  Zoster vaccine.** / 1 dose for adults aged 80 years or older.  Pneumococcal 13-valent conjugate (PCV13) vaccine.** / Consult your health care provider.  Pneumococcal polysaccharide (PPSV23) vaccine.** / 1 dose for all adults aged 20 years and older.  Meningococcal vaccine.** / Consult your health care provider.  Hepatitis A vaccine.** / Consult your health care provider.  Hepatitis B vaccine.** / Consult your health care provider.  Haemophilus influenzae  type b (Hib) vaccine.** / Consult your health care provider. ** Family history and personal history of risk and conditions may change your health care provider's recommendations.   This information is not intended to replace advice given to you by your health care provider. Make sure you discuss any questions you have with your health care provider.   Document Released: 11/02/2001 Document Revised: 09/27/2014 Document Reviewed: 02/01/2011 Elsevier Interactive Patient Education [employer].

## 2015-12-22 NOTE — Assessment & Plan Note (Signed)
Depression screen negative. Health Maintenance reviewed -- TDaP given today. Order for mammogram placed. Patient to check on insurance coverage for Cologuard. Preventive schedule discussed and handout given in AVS. Will obtain fasting labs today.

## 2015-12-22 NOTE — Progress Notes (Signed)
Pre visit review using our clinic review tool, if applicable. No additional management support is needed unless otherwise documented below in the visit note. 

## 2015-12-22 NOTE — Assessment & Plan Note (Signed)
Average risk. Asymptomatic. Refuses breast exam. Order for screening mammogram placed.

## 2015-12-22 NOTE — Progress Notes (Signed)
Patient presents to clinic today for annual exam.  Patient is fasting for labs.  Acute Concerns: Patient seen on 12/10/15 for acute sinusitis. Was placed on 10-day course of Doxycycline. Endorses finishing medication yesterday. Endorses feeling better overall but R-sided sinus pressure and sinus pain persists. Is taking allergy medications as directed.  Chronic Issues: Asthma -- Is currently on Symbicort twice daily as directed. Rare use of Albuterol inhaler. Denies exacerbation or nighttime symptoms.  Is still smoking.   Tobacco Use Disorder -- Is working on cessation. Is down to one cigarette at most per day. Is working on complete cessation.   Overweight -- Body mass index is 25.27 kg/(m^2). Is teaching yoga and pilates. Is working on exercise regimen. Endorses well-balanced diet.   Health Maintenance: Immunizations -- Is unsure of Tetanus shot. Would like TDaP today. Colonoscopy -- Has never had colonoscopy. Would like information for Cologuard. Mammogram -- Due for repeat. [facility]  PAP -- Last PAP smear 2 years ago. Denies history of abnormal PAP smear.   Past Medical History  Diagnosis Date  . Asthma   . Allergy   . Environmental allergies   . Seasonal allergies   . Plantar fasciitis     Bilateral    Past Surgical History  Procedure Laterality Date  . Tonsillectomy    . Cesarean section    . Foot surgery      Right    Current Outpatient Prescriptions on File Prior to Visit  Medication Sig Dispense Refill  . albuterol (PROVENTIL) (2.5 MG/3ML) 0.083% nebulizer solution Take 3 mLs (2.5 mg total) by nebulization every 6 (six) hours as needed for wheezing or shortness of breath. 75 mL 3  . Azelastine-Fluticasone 137-50 MCG/ACT SUSP 1 spray into each nostril twice daily 23 g 3  . budesonide-formoterol (SYMBICORT) 80-4.5 MCG/ACT inhaler Inhale 2 puffs into the lungs 2 (two) times daily. 1 Inhaler 5  . cetirizine (ZYRTEC) 10 MG tablet Take 10 mg  by mouth daily as needed for allergies.    . montelukast (SINGULAIR) 10 MG tablet Take 1 tablet (10 mg total) by mouth at bedtime. 30 tablet 3  . VENTOLIN HFA 108 (90 Base) MCG/ACT inhaler inhale 2 puffs INTO THE LUNGS every 6 hours if needed for wheezing or shortness of breath 18 Inhaler 3  . fexofenadine (ALLEGRA ALLERGY) 180 MG tablet Take 1 tablet (180 mg total) by mouth daily. (Patient not taking: Reported on 12/22/2015) 30 tablet 1   No current facility-administered medications on file prior to visit.    No Known Allergies  Family History  Problem Relation Age of Onset  . Stroke Father 59    Deceased  . Other Father     Borderline Diabetes  . Macular degeneration Mother     Living  . Vascular Disease Mother   . Lung cancer Maternal Uncle     #1  . Heart attack Maternal Uncle     Deceased at 46, #2  . Skin cancer Maternal Uncle     #1  . HIV/AIDS Brother     #1  . Healthy Brother     #2  . Healthy Sister     x2  . Heart defect Son     Bicuspid Valve  . Healthy Son     #2  . Healthy Daughter     x1  . Allergies Son     #1    Social History   Social History  . Marital  Status: Married    Spouse Name: N/A  . Number of Children: N/A  . Years of Education: N/A   Occupational History  . Not on file.   Social History Main Topics  . Smoking status: Current Some Day Smoker -- 0.10 packs/day    Types: Cigarettes  . Smokeless tobacco: Never Used     Comment: 1 cigarette per day  . Alcohol Use: 4.8 oz/week    8 Standard drinks or equivalent per week  . Drug Use: No  . Sexual Activity: Yes   Other Topics Concern  . Not on file   Social History Narrative   Review of Systems  Constitutional: Negative for fever and weight loss.  HENT: Positive for congestion. Negative for ear discharge, ear pain, hearing loss and tinnitus.   Eyes: Negative for blurred vision, double vision, photophobia and pain.  Respiratory: Negative for cough and shortness of breath.     Cardiovascular: Negative for chest pain and palpitations.  Gastrointestinal: Negative for heartburn, nausea, vomiting, abdominal pain, diarrhea, constipation, blood in stool and melena.  Genitourinary: Negative for dysuria, urgency, frequency, hematuria and flank pain.  Musculoskeletal: Negative for falls.  Neurological: Positive for headaches. Negative for dizziness and loss of consciousness.  Endo/Heme/Allergies: Negative for environmental allergies.  Psychiatric/Behavioral: Negative for depression, suicidal ideas, hallucinations and substance abuse. The patient is not nervous/anxious and does not have insomnia.    BP 130/80 mmHg  Pulse 58  Temp(Src) 97.9 F (36.6 C) (Oral)  Ht 5' (1.524 m)  Wt 129 lb 6.4 oz (58.695 kg)  BMI 25.27 kg/m2  SpO2 94%  Physical Exam  Constitutional: She is oriented to person, place, and time and well-developed, well-nourished, and in no distress.  HENT:  Head: Normocephalic and atraumatic.  Right Ear: Tympanic membrane, external ear and ear canal normal.  Left Ear: Tympanic membrane, external ear and ear canal normal.  Nose: Nose normal. No mucosal edema.  Mouth/Throat: Uvula is midline, oropharynx is clear and moist and mucous membranes are normal. No oropharyngeal exudate or posterior oropharyngeal erythema.  Eyes: Conjunctivae are normal. Pupils are equal, round, and reactive to light.  Neck: Neck supple. No thyromegaly present.  Cardiovascular: Normal rate, regular rhythm, normal heart sounds and intact distal pulses.   Pulmonary/Chest: Effort normal and breath sounds normal. No respiratory distress. She has no wheezes. She has no rales.  Declines breast exam today  Abdominal: Soft. Bowel sounds are normal. She exhibits no distension and no mass. There is no tenderness. There is no rebound and no guarding.  Lymphadenopathy:    She has no cervical adenopathy.  Neurological: She is alert and oriented to person, place, and time. No cranial nerve  deficit.  Skin: Skin is warm and dry. No rash noted.  Psychiatric: Affect normal.  Vitals reviewed.    Assessment/Plan: Visit for preventive health examination Depression screen negative. Health Maintenance reviewed -- TDaP given today. Order for mammogram placed. Patient to check on insurance coverage for Cologuard. Preventive schedule discussed and handout given in AVS. Will obtain fasting labs today.   Colon cancer screening Declines Colonoscopy. Is wanting to proceed with Cologuard. She is to call insurance to verify coverage before we send in an order.   Breast cancer screening Average risk. Asymptomatic. Refuses breast exam. Order for screening mammogram placed.  Acute bacterial sinusitis Rx Azithromycin. IM Depomedrol given. Continue allergy medications as directed  Tobacco use disorder Is down to 1 cigarette per day. Next goal is complete cessation. Patient wishes to continue  doing on her own.

## 2015-12-23 LAB — URINALYSIS, ROUTINE W REFLEX MICROSCOPIC
Bilirubin Urine: NEGATIVE
Hgb urine dipstick: NEGATIVE
Leukocytes, UA: NEGATIVE
Nitrite: NEGATIVE
Specific Gravity, Urine: 1.01 (ref 1.000–1.030)
TOTAL PROTEIN, URINE-UPE24: NEGATIVE
URINE GLUCOSE: NEGATIVE
UROBILINOGEN UA: 0.2 (ref 0.0–1.0)
pH: 6 (ref 5.0–8.0)

## 2015-12-23 MED ORDER — METHYLPREDNISOLONE ACETATE 40 MG/ML IJ SUSP
40.0000 mg | Freq: Once | INTRAMUSCULAR | Status: AC
  Administered 2015-12-22: 40 mg via INTRAMUSCULAR

## 2015-12-23 NOTE — Addendum Note (Signed)
Addended by: Verdie ShireBAYNES, Jihaad Bruschi M on: 12/23/2015 08:05 AM   Modules accepted: Orders

## 2015-12-29 ENCOUNTER — Telehealth: Payer: Self-pay | Admitting: Physician Assistant

## 2015-12-29 NOTE — Telephone Encounter (Signed)
LM for pt to call and schedule appt to reasses sx

## 2015-12-29 NOTE — Telephone Encounter (Signed)
Caller name: Avanelle Relationship to patient: self Can be reached: [phone] Pharmacy: [id number] - [place], [place] - [id number]  Reason for call: pt still having congestion, yellow drainage, and ears stopped up. She is not feeling better. Pt not sure if another med needs sent it for her. She is using neti-pot and flonase. She is also taking allergy meds.

## 2015-12-29 NOTE — Telephone Encounter (Signed)
She needs to schedule a follow-up for reassessment.

## 2015-12-31 ENCOUNTER — Encounter: Payer: Self-pay | Admitting: Physician Assistant

## 2015-12-31 ENCOUNTER — Ambulatory Visit (INDEPENDENT_AMBULATORY_CARE_PROVIDER_SITE_OTHER): Payer: BLUE CROSS/BLUE SHIELD | Admitting: Physician Assistant

## 2015-12-31 VITALS — BP 112/70 | HR 68 | Temp 97.8°F | Ht 60.0 in | Wt 127.8 lb

## 2015-12-31 DIAGNOSIS — J330 Polyp of nasal cavity: Secondary | ICD-10-CM | POA: Diagnosis not present

## 2015-12-31 DIAGNOSIS — J329 Chronic sinusitis, unspecified: Secondary | ICD-10-CM | POA: Diagnosis not present

## 2015-12-31 MED ORDER — METHYLPREDNISOLONE 4 MG PO TBPK: ORAL_TABLET | ORAL | Status: DC

## 2015-12-31 NOTE — Progress Notes (Signed)
Pre visit review using our clinic review tool, if applicable. No additional management support is needed unless otherwise documented below in the visit note. 

## 2015-12-31 NOTE — Progress Notes (Signed)
Patient presents to clinic today c/o continued R sided sinus pressure with inability to get nasal discharge out of the nose. Patient has completed two courses of antibiotics with resolution in sinus pain. Is still left with this sensation of pressure. Is a smoker. Denies history of sinus obstruction or polyps.   Past Medical History  Diagnosis Date  . Asthma   . Allergy   . Environmental allergies   . Seasonal allergies   . Plantar fasciitis     Bilateral    Current Outpatient Prescriptions on File Prior to Visit  Medication Sig Dispense Refill  . albuterol (PROVENTIL) (2.5 MG/3ML) 0.083% nebulizer solution Take 3 mLs (2.5 mg total) by nebulization every 6 (six) hours as needed for wheezing or shortness of breath. 75 mL 3  . Azelastine-Fluticasone 137-50 MCG/ACT SUSP 1 spray into each nostril twice daily 23 g 3  . budesonide-formoterol (SYMBICORT) 80-4.5 MCG/ACT inhaler Inhale 2 puffs into the lungs 2 (two) times daily. 1 Inhaler 5  . cetirizine (ZYRTEC) 10 MG tablet Take 10 mg by mouth daily as needed for allergies.    . montelukast (SINGULAIR) 10 MG tablet Take 1 tablet (10 mg total) by mouth at bedtime. 30 tablet 3  . VENTOLIN HFA 108 (90 Base) MCG/ACT inhaler inhale 2 puffs INTO THE LUNGS every 6 hours if needed for wheezing or shortness of breath 18 Inhaler 3  . fexofenadine (ALLEGRA ALLERGY) 180 MG tablet Take 1 tablet (180 mg total) by mouth daily. (Patient not taking: Reported on 12/22/2015) 30 tablet 1   No current facility-administered medications on file prior to visit.    No Known Allergies  Family History  Problem Relation Age of Onset  . Stroke Father 64    Deceased  . Other Father     Borderline Diabetes  . Macular degeneration Mother     Living  . Vascular Disease Mother   . Lung cancer Maternal Uncle     #1  . Heart attack Maternal Uncle     Deceased at 17, #2  . Skin cancer Maternal Uncle     #1  . HIV/AIDS Brother     #1  . Healthy Brother     #2    . Healthy Sister     x2  . Heart defect Son     Bicuspid Valve  . Healthy Son     #2  . Healthy Daughter     x1  . Allergies Son     #1    Social History   Social History  . Marital Status: Married    Spouse Name: N/A  . Number of Children: N/A  . Years of Education: N/A   Social History Main Topics  . Smoking status: Current Some Day Smoker -- 0.10 packs/day    Types: Cigarettes  . Smokeless tobacco: Never Used     Comment: 1 cigarette per day  . Alcohol Use: 4.8 oz/week    8 Standard drinks or equivalent per week  . Drug Use: No  . Sexual Activity: Yes   Other Topics Concern  . None   Social History Narrative    Review of Systems - See HPI.  All other ROS are negative.  BP 112/70 mmHg  Pulse 68  Temp(Src) 97.8 F (36.6 C) (Oral)  Ht 5' (1.524 m)  Wt 127 lb 12.8 oz (57.97 kg)  BMI 24.96 kg/m2  SpO2 96%  Physical Exam  Constitutional: She is oriented to person, place, and time  and well-developed, well-nourished, and in no distress.  HENT:  Head: Normocephalic and atraumatic.  Right Ear: Tympanic membrane normal.  Left Ear: Tympanic membrane normal.  Nose: Nasal deformity present. Right sinus exhibits no maxillary sinus tenderness and no frontal sinus tenderness. Left sinus exhibits no maxillary sinus tenderness and no frontal sinus tenderness.    Mouth/Throat: Uvula is midline, oropharynx is clear and moist and mucous membranes are normal.  Eyes: Conjunctivae are normal.  Cardiovascular: Normal rate, regular rhythm, normal heart sounds and intact distal pulses.   Pulmonary/Chest: Effort normal and breath sounds normal. No respiratory distress. She has no wheezes. She has no rales. She exhibits no tenderness.  Neurological: She is alert and oriented to person, place, and time.  Skin: Skin is warm and dry. No rash noted.  Psychiatric: Affect normal.  Vitals reviewed.   Recent Results (from the past 2160 hour(s))  CBC     Status: Abnormal    Collection Time: 12/22/15  2:20 PM  Result Value Ref Range   WBC 11.2 (H) 4.0 - 10.5 K/uL   RBC 4.16 3.87 - 5.11 Mil/uL   Platelets 647.0 (H) 150.0 - 400.0 K/uL   Hemoglobin 13.3 12.0 - 15.0 g/dL   HCT 38.3 36.0 - 46.0 %   MCV 92.3 78.0 - 100.0 fl   MCHC 34.6 30.0 - 36.0 g/dL   RDW 12.8 11.5 - 15.5 %  Comp Met (CMET)     Status: None   Collection Time: 12/22/15  2:20 PM  Result Value Ref Range   Sodium 138 135 - 145 mEq/L   Potassium 4.0 3.5 - 5.1 mEq/L   Chloride 100 96 - 112 mEq/L   CO2 31 19 - 32 mEq/L   Glucose, Bld 98 70 - 99 mg/dL   BUN 13 6 - 23 mg/dL   Creatinine, Ser 0.58 0.40 - 1.20 mg/dL   Total Bilirubin 0.6 0.2 - 1.2 mg/dL   Alkaline Phosphatase 76 39 - 117 U/L   AST 17 0 - 37 U/L   ALT 11 0 - 35 U/L   Total Protein 7.7 6.0 - 8.3 g/dL   Albumin 4.1 3.5 - 5.2 g/dL   Calcium 9.6 8.4 - 10.5 mg/dL   GFR 116.51 >60.00 mL/min  TSH     Status: None   Collection Time: 12/22/15  2:20 PM  Result Value Ref Range   TSH 1.28 0.35 - 4.50 uIU/mL  Hemoglobin A1c     Status: None   Collection Time: 12/22/15  2:20 PM  Result Value Ref Range   Hgb A1c MFr Bld 5.8 4.6 - 6.5 %    Comment: Glycemic Control Guidelines for People with Diabetes:Non Diabetic:  <6%Goal of Therapy: <7%Additional Action Suggested:  >8%   Urinalysis, Routine w reflex microscopic     Status: Abnormal   Collection Time: 12/22/15  2:20 PM  Result Value Ref Range   Color, Urine YELLOW Yellow;Lt. Yellow   APPearance CLEAR Clear   Specific Gravity, Urine 1.010 1.000-1.030   pH 6.0 5.0 - 8.0   Total Protein, Urine NEGATIVE Negative   Urine Glucose NEGATIVE Negative   Ketones, ur TRACE (A) Negative   Bilirubin Urine NEGATIVE Negative   Hgb urine dipstick NEGATIVE Negative   Urobilinogen, UA 0.2 0.0 - 1.0   Leukocytes, UA NEGATIVE Negative   Nitrite NEGATIVE Negative   WBC, UA 0-2/hpf 0-2/hpf   RBC / HPF 0-2/hpf 0-2/hpf   Squamous Epithelial / LPF Rare(0-4/hpf) Rare(0-4/hpf)  Lipid Profile  Status:  None   Collection Time: 12/22/15  2:20 PM  Result Value Ref Range   Cholesterol 170 0 - 200 mg/dL    Comment: ATP III Classification       Desirable:  < 200 mg/dL               Borderline High:  200 - 239 mg/dL          High:  > = 240 mg/dL   Triglycerides 58.0 0.0 - 149.0 mg/dL    Comment: Normal:  <150 mg/dLBorderline High:  150 - 199 mg/dL   HDL 69.20 >39.00 mg/dL   VLDL 11.6 0.0 - 40.0 mg/dL   LDL Cholesterol 89 0 - 99 mg/dL   Total CHOL/HDL Ratio 2     Comment:                Men          Women1/2 Average Risk     3.4          3.3Average Risk          5.0          4.42X Average Risk          9.6          7.13X Average Risk          15.0          11.0                       NonHDL 101.08     Comment: NOTE:  Non-HDL goal should be 30 mg/dL higher than patient's LDL goal (i.e. LDL goal of < 70 mg/dL, would have non-HDL goal of < 100 mg/dL)    Assessment/Plan: Nasal polyp - anterior Causing sinus pressure 2/2 obstruction. Do not feel further antibiotics as directed. Continue decongestant and Flonase. Rx Medrol dose pack to help calm inflammation. Referral to ENT for assessment and polyp removal.

## 2015-12-31 NOTE — Patient Instructions (Signed)
Please take steroid medication as directed. Continue allergy meds as directed.  You will be contacted by ENT for further management of this nasal polyp as it is preventing your sinuses from draining.

## 2016-01-02 DIAGNOSIS — J330 Polyp of nasal cavity: Secondary | ICD-10-CM | POA: Insufficient documentation

## 2016-01-02 NOTE — Assessment & Plan Note (Signed)
Causing sinus pressure 2/2 obstruction. Do not feel further antibiotics as directed. Continue decongestant and Flonase. Rx Medrol dose pack to help calm inflammation. Referral to ENT for assessment and polyp removal.

## 2016-01-12 ENCOUNTER — Other Ambulatory Visit (INDEPENDENT_AMBULATORY_CARE_PROVIDER_SITE_OTHER): Payer: BLUE CROSS/BLUE SHIELD

## 2016-01-12 DIAGNOSIS — D72829 Elevated white blood cell count, unspecified: Secondary | ICD-10-CM

## 2016-01-12 LAB — CBC
HCT: 38.9 % (ref 36.0–46.0)
Hemoglobin: 13.2 g/dL (ref 12.0–15.0)
MCHC: 34 g/dL (ref 30.0–36.0)
MCV: 94.3 fl (ref 78.0–100.0)
Platelets: 405 10*3/uL — ABNORMAL HIGH (ref 150.0–400.0)
RBC: 4.12 Mil/uL (ref 3.87–5.11)
RDW: 13.4 % (ref 11.5–15.5)
WBC: 11 10*3/uL — AB (ref 4.0–10.5)

## 2016-01-13 ENCOUNTER — Encounter: Payer: Self-pay | Admitting: *Deleted

## 2016-01-26 ENCOUNTER — Ambulatory Visit (HOSPITAL_BASED_OUTPATIENT_CLINIC_OR_DEPARTMENT_OTHER)
Admission: RE | Admit: 2016-01-26 | Discharge: 2016-01-26 | Disposition: A | Discharge status: Home / Self Care | Payer: BLUE CROSS/BLUE SHIELD | Source: Ambulatory Visit | Attending: Physician Assistant | Admitting: Physician Assistant

## 2016-01-26 DIAGNOSIS — Z1231 Encounter for screening mammogram for malignant neoplasm of breast: Secondary | ICD-10-CM | POA: Diagnosis present

## 2016-01-26 DIAGNOSIS — Z1239 Encounter for other screening for malignant neoplasm of breast: Secondary | ICD-10-CM | POA: Insufficient documentation

## 2016-03-29 ENCOUNTER — Other Ambulatory Visit: Payer: Self-pay | Admitting: Physician Assistant

## 2016-03-29 NOTE — Telephone Encounter (Signed)
Refill sent per [facility] refill protocol/SLS  

## 2016-03-30 ENCOUNTER — Telehealth: Payer: Self-pay | Admitting: Physician Assistant

## 2016-03-30 NOTE — Telephone Encounter (Signed)
Can certainly place for her but typically Allergist is not needed. Can assess her in office first. Let me know what she wishes and we will accommodate it for her.

## 2016-03-30 NOTE — Telephone Encounter (Signed)
Relation to ON:GEXBpt:self Call back number:[phone] Pharmacy:  Reason for call:  Patient requesting a referral to allergist due to right nostril being stuffy, nose runing while sleeping. Please advise

## 2016-03-30 NOTE — Telephone Encounter (Signed)
LMOM with contact name and number for return call RE: response on referral and/or appointment [with need to see allergist i.e: chronic condition] per provider instructions/SLS 07/11

## 2016-04-01 ENCOUNTER — Ambulatory Visit (INDEPENDENT_AMBULATORY_CARE_PROVIDER_SITE_OTHER): Payer: BLUE CROSS/BLUE SHIELD | Admitting: Medical

## 2016-04-01 ENCOUNTER — Encounter: Payer: Self-pay | Admitting: Medical

## 2016-04-01 VITALS — BP 116/80 | HR 87 | Temp 98.1°F | Ht 60.0 in | Wt 122.4 lb

## 2016-04-01 DIAGNOSIS — J0100 Acute maxillary sinusitis, unspecified: Secondary | ICD-10-CM | POA: Diagnosis not present

## 2016-04-01 DIAGNOSIS — J309 Allergic rhinitis, unspecified: Secondary | ICD-10-CM | POA: Diagnosis not present

## 2016-04-01 MED ORDER — FLUCONAZOLE 150 MG PO TABS
150.0000 mg | ORAL_TABLET | Freq: Once | ORAL | Status: DC
Start: 2016-04-01 — End: 2016-06-14

## 2016-04-01 MED ORDER — CEFTRIAXONE SODIUM 1 G IJ SOLR
1.0000 g | Freq: Once | INTRAMUSCULAR | Status: AC
  Administered 2016-04-01: 1 g via INTRAMUSCULAR

## 2016-04-01 MED ORDER — METHYLPREDNISOLONE ACETATE 40 MG/ML IJ SUSP
40.0000 mg | Freq: Once | INTRAMUSCULAR | Status: AC
  Administered 2016-04-01: 40 mg via INTRAMUSCULAR

## 2016-04-01 MED ORDER — AMOXICILLIN-POT CLAVULANATE 875-125 MG PO TABS: 1.0000 | ORAL_TABLET | Freq: Two times a day (BID) | ORAL | Status: DC

## 2016-04-01 NOTE — Progress Notes (Signed)
Pre visit review using our clinic review tool, if applicable. No additional management support is needed unless otherwise documented below in the visit note. 

## 2016-04-01 NOTE — Patient Instructions (Addendum)
You appeared to have allergic rhinitis recently followed by  sinus infection.  Will give depomedrol 40 mg today. Will give rocephin 1 gram.  Continue flonase.  Rx augmentin.  Follow up in 7-10 days or as needed

## 2016-04-01 NOTE — Addendum Note (Signed)
Addended by: Neldon LabellaMABE, HOLDEN S on: 04/01/2016 06:13 PM   Modules accepted: Orders

## 2016-04-01 NOTE — Progress Notes (Signed)
Subjective:    Patient ID: Jane Spencer, female    DOB: 1966/09/15, 50 y.o.   MRN: [id number]  HPI  Pt in for a lot of nasal congestion and rt side maxillary and frontal sinus pressure. Symptoms for about 8 days. [place] in [place]. Feels a lot of pnd.   This afternoon moderate to severe rt side maxillary pain. Upper teeth pain. But subsided with warm compress.  Pt has history of allergic rhinitis.  Some green mucous if blows nose. Pt is smoker but on 1 cigarrette a day at most.    Review of Systems  Constitutional: Negative for fever, chills and fatigue.  HENT: Positive for congestion, postnasal drip and sinus pressure. Negative for ear pain and sneezing.   Respiratory: Negative for cough and wheezing.   Cardiovascular: Negative for chest pain and palpitations.  Gastrointestinal: Negative for abdominal pain.  Musculoskeletal: Negative for back pain, neck pain and neck stiffness.  Neurological: Negative for dizziness and headaches.  Hematological: Negative for adenopathy. Does not bruise/bleed easily.  Psychiatric/Behavioral: Negative for behavioral problems and confusion.    Past Medical History  Diagnosis Date  . Asthma   . Allergy   . Environmental allergies   . Seasonal allergies   . Plantar fasciitis     Bilateral     Social History   Social History  . Marital Status: Married    Spouse Name: N/A  . Number of Children: N/A  . Years of Education: N/A   Occupational History  . Not on file.   Social History Main Topics  . Smoking status: Current Some Day Smoker -- 0.10 packs/day    Types: Cigarettes  . Smokeless tobacco: Never Used     Comment: 1 cigarette per day  . Alcohol Use: 4.8 oz/week    8 Standard drinks or equivalent per week  . Drug Use: No  . Sexual Activity: Yes   Other Topics Concern  . Not on file   Social History Narrative    Past Surgical History  Procedure Laterality Date  . Tonsillectomy    . Cesarean section    .  Foot surgery      Right    Family History  Problem Relation Age of Onset  . Stroke Father 6289    Deceased  . Other Father     Borderline Diabetes  . Macular degeneration Mother     Living  . Vascular Disease Mother   . Lung cancer Maternal Uncle     #1  . Heart attack Maternal Uncle     Deceased at 5542, #2  . Skin cancer Maternal Uncle     #1  . HIV/AIDS Brother     #1  . Healthy Brother     #2  . Healthy Sister     x2  . Heart defect Son     Bicuspid Valve  . Healthy Son     #2  . Healthy Daughter     x1  . Allergies Son     #1    No Known Allergies  Current Outpatient Prescriptions on File Prior to Visit  Medication Sig Dispense Refill  . albuterol (PROVENTIL) (2.5 MG/3ML) 0.083% nebulizer solution Take 3 mLs (2.5 mg total) by nebulization every 6 (six) hours as needed for wheezing or shortness of breath. 75 mL 3  . Azelastine-Fluticasone 137-50 MCG/ACT SUSP 1 spray into each nostril twice daily 23 g 3  . budesonide-formoterol (SYMBICORT) 80-4.5 MCG/ACT inhaler Inhale 2 puffs  into the lungs 2 (two) times daily. 1 Inhaler 5  . cetirizine (ZYRTEC) 10 MG tablet Take 10 mg by mouth daily as needed for allergies.    . fexofenadine (ALLEGRA ALLERGY) 180 MG tablet Take 1 tablet (180 mg total) by mouth daily. 30 tablet 1  . methylPREDNISolone (MEDROL DOSEPAK) 4 MG TBPK tablet Take following package directions 21 tablet 0  . montelukast (SINGULAIR) 10 MG tablet Take 1 tablet (10 mg total) by mouth at bedtime. 30 tablet 3  . VENTOLIN HFA 108 (90 Base) MCG/ACT inhaler inhale 2 puffs by mouth every 6 hours if needed for SHORTNESS OF BREATH or wheezing 18 Inhaler 3   No current facility-administered medications on file prior to visit.    BP 116/80 mmHg  Pulse 87  Temp(Src) 98.1 F (36.7 C) (Oral)  Ht 5' (1.524 m)  Wt 122 lb 6.4 oz (55.52 kg)  BMI 23.90 kg/m2  SpO2 98%       Objective:   Physical Exam  General  Mental Status - Alert. General Appearance - Well  groomed. Not in acute distress.  Skin Rashes- No Rashes.  HEENT Head- Normal. Ear Auditory Canal - Left- Normal. Right - Normal.Tympanic Membrane- Left- Normal. Right- Normal. Eye Sclera/Conjunctiva- Left- Normal. Right- Normal. Nose & Sinuses Nasal Mucosa- Left-  Boggy and Congested. Right-  Boggy and Congested.Bilateral rt side maxillary and rt side  frontal sinus pressure. Mouth & Throat Lips: Upper Lip- Normal: no dryness, cracking, pallor, cyanosis, or vesicular eruption. Lower Lip-Normal: no dryness, cracking, pallor, cyanosis or vesicular eruption. Buccal Mucosa- Bilateral- No Aphthous ulcers. Oropharynx- No Discharge or Erythema. +pnd Tonsils: Characteristics- Bilateral- No Erythema or Congestion. Size/Enlargement- Bilateral- No enlargement. Discharge- bilateral-None.  Neck Neck- Supple. No Masses.   Chest and Lung Exam Auscultation: Breath Sounds:-Clear even and unlabored.  Cardiovascular Auscultation:Rythm- Regular, rate and rhythm. Murmurs & Other Heart Sounds:Ausculatation of the heart reveal- No Murmurs.  Lymphatic Head & Neck General Head & Neck Lymphatics: Bilateral: Description- No Localized lymphadenopathy.       Assessment & Plan:  You appeared to have allergic rhinitis recently followed by sinus infection.  Will give depomedrol 40 mg today. Will give rocephin 1 gram.  Continue flonase.  Rx augmentin.  Follow up in 7-10 days or as needed  Zyra Parrillo, Ramon Dredge, [employer]

## 2016-04-05 ENCOUNTER — Ambulatory Visit: Payer: BLUE CROSS/BLUE SHIELD | Admitting: Physician Assistant

## 2016-04-05 NOTE — Telephone Encounter (Signed)
Had OV with Esperanza RichtersEdward Saguier, PA-C on 04/01/16/SLS 07/17

## 2016-05-06 ENCOUNTER — Ambulatory Visit: Payer: BLUE CROSS/BLUE SHIELD | Admitting: Family Medicine

## 2016-05-10 ENCOUNTER — Ambulatory Visit: Payer: BLUE CROSS/BLUE SHIELD | Admitting: Physician Assistant

## 2016-06-14 ENCOUNTER — Encounter: Payer: Self-pay | Admitting: Family Medicine

## 2016-06-14 ENCOUNTER — Ambulatory Visit (INDEPENDENT_AMBULATORY_CARE_PROVIDER_SITE_OTHER): Payer: BLUE CROSS/BLUE SHIELD | Admitting: Family Medicine

## 2016-06-14 ENCOUNTER — Telehealth: Payer: Self-pay | Admitting: Physician Assistant

## 2016-06-14 VITALS — BP 118/62 | HR 56 | Temp 97.9°F | Ht 60.0 in | Wt 130.4 lb

## 2016-06-14 DIAGNOSIS — J339 Nasal polyp, unspecified: Secondary | ICD-10-CM

## 2016-06-14 DIAGNOSIS — J329 Chronic sinusitis, unspecified: Secondary | ICD-10-CM | POA: Diagnosis not present

## 2016-06-14 DIAGNOSIS — J4530 Mild persistent asthma, uncomplicated: Secondary | ICD-10-CM

## 2016-06-14 DIAGNOSIS — J310 Chronic rhinitis: Secondary | ICD-10-CM

## 2016-06-14 MED ORDER — AMOXICILLIN-POT CLAVULANATE 875-125 MG PO TABS: 1.0000 | ORAL_TABLET | Freq: Two times a day (BID) | ORAL | 0 refills | Status: DC

## 2016-06-14 MED ORDER — AZELASTINE-FLUTICASONE 137-50 MCG/ACT NA SUSP: NASAL | 3 refills | Status: DC

## 2016-06-14 NOTE — Telephone Encounter (Signed)
Relation to WU:JWJXpt:self Call back number:[phone] Pharmacy: [facility] - [place], [place] - [phone]  Reason for call:  Patient requesting a refill Azelastine-Fluticasone 137-50 MCG/ACT SUSP patient states she saw Dr. Carmelia RollerWendling today. Please advise

## 2016-06-14 NOTE — Patient Instructions (Addendum)
Elyn PeersMoore, David Ferguson Jr., MD  [address]  [id number]  [place], [place] [address]  [phone]   Call Dr. Christell ConstantMoore and find out what the plan is with your nasal polyp/congestion.  Ibuprofen 600 mg (3 tabs) 3-4 times a day as needed will give you maximum benefit for pain relief. You can add Tylenol as well.  Wait 3 days before starting antibiotic.

## 2016-06-14 NOTE — Progress Notes (Signed)
Chief Complaint  Patient presents with  . Sinus Problem    face pain-since last week    Jerika Matzek here for URI complaints.  Duration: 1 week Associated symptoms: R sided dental and facial pain, nasal congestion, ear fullness Denies: rhinorrhea, itchy watery eyes, red eyes, ear drainage, sore throat, shortness of breath and fevers Treatment to date: OTC nonsteroidals PRN, Zyrtec, Flonase/Azeltine, Singulair, Netty Pot and fluids and rest Sick contacts: No  ROS:  Const: Denies fevers HEENT: As noted in HPI Lungs: No SOB  Past Medical History:  Diagnosis Date  . Allergy   . Asthma   . Environmental allergies   . Plantar fasciitis    Bilateral  . Seasonal allergies    Family History  Problem Relation Age of Onset  . Stroke Father 2889    Deceased  . Other Father     Borderline Diabetes  . Macular degeneration Mother     Living  . Vascular Disease Mother   . Lung cancer Maternal Uncle     #1  . Heart attack Maternal Uncle     Deceased at 5142, #2  . Skin cancer Maternal Uncle     #1  . HIV/AIDS Brother     #1  . Healthy Brother     #2  . Healthy Sister     x2  . Heart defect Son     Bicuspid Valve  . Healthy Son     #2  . Healthy Daughter     x1  . Allergies Son     #1    BP 118/62 (BP Location: Left Arm, Patient Position: Sitting, Cuff Size: Normal)   Pulse (!) 56   Temp 97.9 F (36.6 C) (Oral)   Ht 5' (1.524 m)   Wt 130 lb 6.4 oz (59.1 kg)   SpO2 97%   BMI 25.47 kg/m  General: Awake, alert, appears stated age HEENT: AT, Center, ears patent b/l and TM's neg, nares patent w/o discharge, R sided nasal polyp does not obstruct, +TTP to R max and frontal sinus, pharynx pink and without exudates, MMM Neck: No masses or asymmetry Heart: RRR, no murmurs, no bruits Lungs: CTAB, no accessory muscle use Psych: Age appropriate judgment and insight, normal mood and affect  Rhinosinusitis - Plan: amoxicillin-clavulanate (AUGMENTIN) 875-125 MG tablet  Nasal  polyp  Orders as above. Wait 3 days before filling abx. Gave instructions for maximizing NSAID for pain control. Call ENT, Dr. Christell ConstantMoore for further plan regarding congestion/polyp.  F/u prn. Consider injection of steroid here vs CT sinus. Pt voiced understanding and agreement to the plan.  Jilda Rocheicholas Paul GoodmanWendling, DO 06/14/16 2:38 PM

## 2016-06-14 NOTE — Progress Notes (Signed)
Pre visit review using our clinic review tool, if applicable. No additional management support is needed unless otherwise documented below in the visit note. 

## 2016-06-14 NOTE — Telephone Encounter (Signed)
Rx sent to the pharmacy by e-script.//AB/CMA 

## 2016-06-15 MED ORDER — AZELASTINE HCL 0.1 % NA SOLN: 1.0000 | Freq: Two times a day (BID) | NASAL | 2 refills | Status: DC

## 2016-06-15 MED ORDER — FLUTICASONE PROPIONATE 50 MCG/ACT NA SUSP: 1.0000 | Freq: Every day | NASAL | 2 refills | Status: DC

## 2016-06-15 NOTE — Telephone Encounter (Signed)
Rx request to pharmacy/SLS 09/26

## 2016-06-15 NOTE — Addendum Note (Signed)
Addended by: Regis BillSCATES, SHARON L on: 06/15/2016 06:04 PM   Modules accepted: Orders

## 2016-06-15 NOTE — Telephone Encounter (Signed)
Caller name: Relationship to patient: Self Can be reached: [phone] Pharmacy:  [facility] - [place], [place] - [address] [phone] (Phone) [phone] (Fax)   Reason for call: Patient called stating that she went to pick up the nasal spray Azelastine-Fluticasone 137-50 MCG/ACT SUSP [[id number]][[id number]] and it was $200. Patient states she can not afford it and would like to have an alternative called in.

## 2016-06-15 NOTE — Telephone Encounter (Signed)
Can do the separate medications -- Flonase and Astelin if the patient wants. This would give the same combination but should be much cheaper. If so -- Rx Flonase 1-2 sprays each nostril once daily and Rx Astelin 1 spray each nostril BID.

## 2016-06-16 NOTE — Telephone Encounter (Signed)
Please make sure to speak with patient regarding the change in medication

## 2016-06-16 NOTE — Telephone Encounter (Addendum)
Phone message was left open on desktop, was waiting until next day to call patient d/t time of evening medication order was sent to pharmacy on 06/15/16; patient informed via telephone today, understood & agreed/SLS 09/27

## 2016-07-08 ENCOUNTER — Other Ambulatory Visit: Payer: Self-pay | Admitting: Physician Assistant

## 2016-07-08 DIAGNOSIS — J302 Other seasonal allergic rhinitis: Secondary | ICD-10-CM

## 2016-07-08 DIAGNOSIS — J4530 Mild persistent asthma, uncomplicated: Secondary | ICD-10-CM

## 2016-07-08 NOTE — Telephone Encounter (Signed)
Medication filled to pharmacy as requested.   

## 2016-09-06 ENCOUNTER — Ambulatory Visit (INDEPENDENT_AMBULATORY_CARE_PROVIDER_SITE_OTHER): Payer: BLUE CROSS/BLUE SHIELD | Admitting: Physician Assistant

## 2016-09-06 ENCOUNTER — Encounter: Payer: Self-pay | Admitting: Physician Assistant

## 2016-09-06 VITALS — BP 120/78 | HR 62 | Temp 98.3°F | Resp 14 | Ht 60.0 in | Wt 128.0 lb

## 2016-09-06 DIAGNOSIS — R5383 Other fatigue: Secondary | ICD-10-CM | POA: Diagnosis not present

## 2016-09-06 DIAGNOSIS — E041 Nontoxic single thyroid nodule: Secondary | ICD-10-CM | POA: Diagnosis not present

## 2016-09-06 LAB — COMPREHENSIVE METABOLIC PANEL
ALBUMIN: 4.7 g/dL (ref 3.5–5.2)
ALK PHOS: 62 U/L (ref 39–117)
ALT: 13 U/L (ref 0–35)
AST: 17 U/L (ref 0–37)
BUN: 10 mg/dL (ref 6–23)
CO2: 30 mEq/L (ref 19–32)
CREATININE: 0.77 mg/dL (ref 0.40–1.20)
Calcium: 10.3 mg/dL (ref 8.4–10.5)
Chloride: 101 mEq/L (ref 96–112)
GFR: 83.78 mL/min (ref >60.00)
Glucose, Bld: 99 mg/dL (ref 70–99)
Potassium: 4.3 mEq/L (ref 3.5–5.1)
SODIUM: 138 meq/L (ref 135–145)
TOTAL PROTEIN: 7.2 g/dL (ref 6.0–8.3)
Total Bilirubin: 0.4 mg/dL (ref 0.2–1.2)

## 2016-09-06 LAB — CBC
HEMATOCRIT: 42.2 % (ref 36.0–46.0)
Hemoglobin: 14.4 g/dL (ref 12.0–15.0)
MCHC: 34.1 g/dL (ref 30.0–36.0)
MCV: 92.5 fl (ref 78.0–100.0)
Platelets: 416 10*3/uL — ABNORMAL HIGH (ref 150.0–400.0)
RBC: 4.56 Mil/uL (ref 3.87–5.11)
RDW: 12.4 % (ref 11.5–15.5)
WBC: 10.4 10*3/uL (ref 4.0–10.5)

## 2016-09-06 LAB — T4, FREE: Free T4: 0.79 ng/dL (ref 0.60–1.60)

## 2016-09-06 LAB — TSH: TSH: 1.69 u[IU]/mL (ref 0.35–4.50)

## 2016-09-06 MED ORDER — MECLIZINE HCL 25 MG PO TABS: 25.0000 mg | ORAL_TABLET | Freq: Three times a day (TID) | ORAL | 0 refills | Status: DC | PRN

## 2016-09-06 NOTE — Patient Instructions (Signed)
Please go to the lab for blood work. I will call you with your results.  Hydrate well, eat a well-balanced diet and get plenty of rest. This could be related to a viral illness that you are getting over. Your exam looks good today except for the thyroid nodule noted. You will be contacted for an Ultrasound to further assess.  Please follow-up with me Friday.  Return sooner if needed.

## 2016-09-06 NOTE — Progress Notes (Addendum)
Patient presents to clinic today c/o 3 days of fatigue with a sensation of feeling off-balance. Denies true vertigo. Denies lightheadedness. Notes episode of insomnia prior to fatigue. Notes that night, 3 nights ago, feeling her heart racing. Denies chest pain, SOB, headaches, vision changes or AMS. Endorses taking a tylenol PM and falling asleep. Denies recurrence of racing heart since that time. Denies cough, nasal/chest congestion, sinus pressure/pain or ear pressure/pain, fever, chills.   Past Medical History:  Diagnosis Date  . Allergy   . Asthma   . Environmental allergies   . Plantar fasciitis    Bilateral  . Seasonal allergies     Current Outpatient Prescriptions on File Prior to Visit  Medication Sig Dispense Refill  . budesonide-formoterol (SYMBICORT) 80-4.5 MCG/ACT inhaler Inhale 2 puffs into the lungs 2 (two) times daily. 1 Inhaler 5  . cetirizine (ZYRTEC) 10 MG tablet Take 10 mg by mouth daily as needed for allergies.    . fluticasone (FLONASE) 50 MCG/ACT nasal spray Place 1-2 sprays into both nostrils daily. 16 g 2  . montelukast (SINGULAIR) 10 MG tablet take 1 tablet by mouth at bedtime 30 tablet 3  . VENTOLIN HFA 108 (90 Base) MCG/ACT inhaler inhale 2 puffs by mouth every 6 hours if needed for SHORTNESS OF BREATH or wheezing 18 Inhaler 3  . azelastine (ASTELIN) 0.1 % nasal spray Place 1 spray into both nostrils 2 (two) times daily. Use in each nostril as directed (Patient not taking: Reported on 09/06/2016) 30 mL 2  . Azelastine-Fluticasone 137-50 MCG/ACT SUSP 1 spray into each nostril twice daily (Patient not taking: Reported on 09/06/2016) 23 g 3   No current facility-administered medications on file prior to visit.     No Known Allergies  Family History  Problem Relation Age of Onset  . Stroke Father 5889    Deceased  . Other Father     Borderline Diabetes  . Macular degeneration Mother     Living  . Vascular Disease Mother   . Lung cancer Maternal Uncle    #1  . Heart attack Maternal Uncle     Deceased at 7142, #2  . Skin cancer Maternal Uncle     #1  . HIV/AIDS Brother     #1  . Healthy Brother     #2  . Healthy Sister     x2  . Heart defect Son     Bicuspid Valve  . Healthy Son     #2  . Healthy Daughter     x1  . Allergies Son     #1   Social History   Social History  . Marital status: Married    Spouse name: N/A  . Number of children: N/A  . Years of education: N/A   Social History Main Topics  . Smoking status: Current Some Day Smoker    Packs/day: 0.10    Types: Cigarettes  . Smokeless tobacco: Never Used     Comment: 1 cigarette per day  . Alcohol use 4.8 oz/week    8 Standard drinks or equivalent per week  . Drug use: No  . Sexual activity: Yes   Other Topics Concern  . None   Social History Narrative  . None   Review of Systems - See HPI.  All other ROS are negative.  BP 120/78   Pulse 62   Temp 98.3 F (36.8 C) (Oral)   Resp 14   Ht 5' (1.524 m)   Wt 128 lb (  58.1 kg)   SpO2 97%   BMI 25.00 kg/m   Physical Exam  Constitutional: She is oriented to person, place, and time and well-developed, well-nourished, and in no distress.  HENT:  Head: Normocephalic and atraumatic.  Right Ear: Tympanic membrane normal.  Left Ear: Tympanic membrane normal.  Nose: Nose normal.  1 cm nodule palpated at the lateral aspect of L lobe of thyroid. No other nodule of thyromegaly noted.   Eyes: Conjunctivae are normal.  Neck: Neck supple.  Cardiovascular: Normal rate, regular rhythm, normal heart sounds and intact distal pulses.   Pulmonary/Chest: Effort normal and breath sounds normal. No respiratory distress. She has no wheezes. She has no rales. She exhibits no tenderness.  Neurological: She is alert and oriented to person, place, and time. She has intact cranial nerves.  Skin: Skin is warm and dry. No rash noted.  Psychiatric: Affect normal.  Vitals reviewed.  Assessment/Plan: 1. Other  fatigue Unsepcified. Question viral prodrome. EKG with sinus bradycardia (chronic and asymptomatic). OVS negative. Neuro examination without abnormal findings. Will check lab panel today. Encouraged hydration, rest. Meclizine in case of recurrent dizzy spell. Alarm sign/symptoms discussed with patient that would prompt ER assessment.  - EKG 12-Lead - CBC - Comprehensive metabolic panel - TSH  2. Thyroid nodule Noted. Will check TFTs and US. - TSH - US THYROID; Future - T4, free   Piedad ClimesMartin, [place], PA-C

## 2016-09-06 NOTE — Progress Notes (Signed)
Pre visit review using our clinic review tool, if applicable. No additional management support is needed unless otherwise documented below in the visit note. 

## 2016-09-08 ENCOUNTER — Other Ambulatory Visit: Payer: Self-pay | Admitting: Physician Assistant

## 2016-09-08 ENCOUNTER — Ambulatory Visit (HOSPITAL_BASED_OUTPATIENT_CLINIC_OR_DEPARTMENT_OTHER)
Admission: RE | Admit: 2016-09-08 | Discharge: 2016-09-08 | Disposition: A | Discharge status: Home / Self Care | Payer: BLUE CROSS/BLUE SHIELD | Source: Ambulatory Visit | Attending: Physician Assistant | Admitting: Physician Assistant

## 2016-09-08 DIAGNOSIS — Z1231 Encounter for screening mammogram for malignant neoplasm of breast: Secondary | ICD-10-CM

## 2016-09-08 DIAGNOSIS — E041 Nontoxic single thyroid nodule: Secondary | ICD-10-CM | POA: Diagnosis present

## 2016-09-10 ENCOUNTER — Ambulatory Visit: Payer: BLUE CROSS/BLUE SHIELD | Admitting: Physician Assistant

## 2016-09-10 ENCOUNTER — Other Ambulatory Visit: Payer: Self-pay | Admitting: Physician Assistant

## 2016-09-10 DIAGNOSIS — J302 Other seasonal allergic rhinitis: Secondary | ICD-10-CM

## 2016-09-10 DIAGNOSIS — J4530 Mild persistent asthma, uncomplicated: Secondary | ICD-10-CM

## 2016-11-02 ENCOUNTER — Other Ambulatory Visit: Payer: Self-pay | Admitting: Physician Assistant

## 2017-01-20 ENCOUNTER — Other Ambulatory Visit: Payer: Self-pay | Admitting: Physician Assistant

## 2017-01-20 DIAGNOSIS — J4530 Mild persistent asthma, uncomplicated: Secondary | ICD-10-CM

## 2017-01-20 DIAGNOSIS — J302 Other seasonal allergic rhinitis: Secondary | ICD-10-CM

## 2017-01-31 ENCOUNTER — Ambulatory Visit (HOSPITAL_BASED_OUTPATIENT_CLINIC_OR_DEPARTMENT_OTHER): Payer: BLUE CROSS/BLUE SHIELD

## 2017-02-07 ENCOUNTER — Other Ambulatory Visit: Payer: Self-pay | Admitting: Physician Assistant

## 2017-03-15 ENCOUNTER — Other Ambulatory Visit: Payer: Self-pay | Admitting: Physician Assistant

## 2017-05-04 ENCOUNTER — Encounter: Payer: Self-pay | Admitting: Emergency Medicine

## 2017-05-18 ENCOUNTER — Telehealth: Payer: Self-pay | Admitting: Physician Assistant

## 2017-05-18 ENCOUNTER — Telehealth: Payer: Self-pay | Admitting: Emergency Medicine

## 2017-05-18 DIAGNOSIS — J302 Other seasonal allergic rhinitis: Secondary | ICD-10-CM

## 2017-05-18 DIAGNOSIS — J4530 Mild persistent asthma, uncomplicated: Secondary | ICD-10-CM

## 2017-05-18 MED ORDER — ALBUTEROL SULFATE HFA 108 (90 BASE) MCG/ACT IN AERS: INHALATION_SPRAY | RESPIRATORY_TRACT | 3 refills | Status: DC

## 2017-05-18 MED ORDER — MONTELUKAST SODIUM 10 MG PO TABS: 10.0000 mg | ORAL_TABLET | Freq: Every day | ORAL | 3 refills | Status: DC

## 2017-05-18 NOTE — Telephone Encounter (Signed)
Relation to ZO:XWRUpt:self Call back [phone] Pharmacy: [facility] [place] - [address] [phone] (Phone) [phone])     Reason for call:  Patient requesting a refill montelukast (SINGULAIR) 10 MG tablet, RA ALLERGY RELIEF 10 MG tablet, SYMBICORT 80-4.5 MCG/ACT inhaler, albuterol (PROVENTIL) (2.5 MG/3ML) 0.083% nebulizer solution, patient requesting 90 day supply.   [facility] Patsy Lageropland due to location being far, please advise

## 2017-05-18 NOTE — Telephone Encounter (Signed)
LMOVM advising patient is due for CPE. Please call back to schedule appointment for CPE

## 2017-05-18 NOTE — Telephone Encounter (Signed)
Meds refilled. Ok to transfer.

## 2017-05-18 NOTE — Telephone Encounter (Signed)
Ok to transfer to me

## 2017-05-19 NOTE — Telephone Encounter (Signed)
Patient scheduled for 06/22/2017 with Dr. Patsy Lageropland

## 2017-05-30 ENCOUNTER — Other Ambulatory Visit: Payer: Self-pay | Admitting: Physician Assistant

## 2017-05-30 NOTE — Telephone Encounter (Signed)
Med filled #30 as pt is establishing with Dr. Patsy Lageropland 06/22/17

## 2017-06-21 NOTE — Progress Notes (Addendum)
[place] [facility] [address] [place], [place] [address] [phone]  Date:  06/22/2017   Name:  Jane Spencer   DOB:  Dec 17, 1965   MRN:  [id number]  PCP:  Darreld Mclean, MD    Chief Complaint: No chief complaint on file.   History of Present Illness:  Jane Spencer is a 51 y.o. very pleasant female patient who presents with the following:  Here today seeking a CPE.  She is a former pt of Elyn Aquas, PA-C who moved to a new location.  Per his last CPE note:  Acute Concerns: Patient seen on 12/10/15 for acute sinusitis. Was placed on 10-day course of Doxycycline. Endorses finishing medication yesterday. Endorses feeling better overall but R-sided sinus pressure and sinus pain persists. Is taking allergy medications as directed.  Chronic Issues: Asthma -- Is currently on Symbicort twice daily as directed. Rare use of Albuterol inhaler. Denies exacerbation or nighttime symptoms.  Is still smoking.   Tobacco Use Disorder -- Is working on cessation. Is down to one cigarette at most per day. Is working on complete cessation.   Overweight -- Body mass index is 25.27 kg/(m^2). Is teaching yoga and pilates. Is working on exercise regimen. Endorses well-balanced diet.   Health Maintenance: Immunizations -- Is unsure of Tetanus shot. Would like TDaP today. Colonoscopy -- Has never had colonoscopy. Would like information for Cologuard. Mammogram -- Due for repeat. [facility]  PAP -- Last PAP smear 2 years ago. Denies history of abnormal PAP smear  Asthma: under good control Smoking: Last labs: December of 2017 Pap: 2015, due today- she would like to do today Mammo: 5/17 Colon cancer screening: she never got this done last year, did not get the kit  Flu shot: pt declines today  She moved to [place] from [place] about 6 years ago- she had been living in [place] She had a lot of allergies when she first  moved Her allergies are under pretty good control right now with her current regimen However, her right ear does feel like it gets full sometimes, and the ear can be painful on occasion  She is a Scientist, 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  She does not have any sx at all when she is is teaching  She did have some toast with PB this morning  She teaches about 10 classes over 4 days of the week She does have some foot and hip stiffness   She did hurt her right calf- a table fell on her posterior leg about 2 weeks ago She has avoided plymometric moves for the time being   Her asthma has been quiet - she uses singulair and symbicort daily.  Rarely needs her albuterol except that she does use it before she teaches class She does not wake up wheezing   Her 75 yo daughter still lives at home- her older children are 37 and 30 yo, they are grown and gone   Patient Active Problem List   Diagnosis Date Noted  . Nasal polyp - anterior 01/02/2016  . Tobacco use disorder 12/22/2015  . Asthma, mild persistent 02/26/2015  Past Medical History:  Diagnosis Date  . Allergy   . Asthma   . Environmental allergies   . Plantar fasciitis    Bilateral  . Seasonal allergies     Past Surgical History:  Procedure Laterality Date  . CESAREAN SECTION    . FOOT SURGERY     Right  . TONSILLECTOMY      Social History  Substance Use Topics  . Smoking status: Current Some Day Smoker    Packs/day: 0.10    Types: Cigarettes  . Smokeless tobacco: Never Used     Comment: 1 cigarette per day  . Alcohol use 4.8 oz/week    8 Standard drinks or equivalent per week    Family History  Problem Relation Age of Onset  . Stroke Father 73       Deceased   . Other Father        Borderline Diabetes  . Macular degeneration Mother        Living  . Vascular Disease Mother   . Lung cancer Maternal Uncle        #1  . Heart attack Maternal Uncle        Deceased at 35, #2  . Skin cancer Maternal Uncle        #1  . HIV/AIDS Brother        #1  . Healthy Brother        #2  . Healthy Sister        x2  . Heart defect Son        Bicuspid Valve  . Healthy Son        #2  . Healthy Daughter        x1  . Allergies Son        #1    No Known Allergies  Medication list has been reviewed and updated.  Current Outpatient Prescriptions on File Prior to Visit  Medication Sig Dispense Refill  . albuterol (VENTOLIN HFA) 108 (90 Base) MCG/ACT inhaler inhale 2 puffs by mouth every 6 hours if needed for cough or wheezing 18 g 3  . azelastine (ASTELIN) 0.1 % nasal spray Place 1 spray into both nostrils 2 (two) times daily. Use in each nostril as directed 30 mL 2  . Azelastine-Fluticasone 137-50 MCG/ACT SUSP 1 spray into each nostril twice daily 23 g 3  . cetirizine (ZYRTEC) 10 MG tablet take 1 tablet by mouth once daily if needed 30 tablet 0  . fluticasone (FLONASE) 50 MCG/ACT nasal spray instill 1 to 2 sprays into each nostril once daily 16 g 2  . meclizine (ANTIVERT) 25 MG tablet Take 1 tablet (25 mg total) by mouth 3 (three) times daily as needed for dizziness. 30 tablet 0  . montelukast (SINGULAIR) 10 MG tablet Take 1 tablet (10 mg total) by mouth at bedtime. 30 tablet 3  . SYMBICORT 80-4.5 MCG/ACT inhaler inhale 2 puffs by mouth twice a day 10.2 g 4   No current facility-administered medications on file prior to visit.     Review of Systems:  As per HPI- otherwise negative.   Physical Examination: Vitals:   06/22/17 1206  BP: 122/61  Pulse: 67  Temp: 98 F (36.7 C)  SpO2: 97%   Vitals:   06/22/17 1206  Weight: 133 lb 3.2 oz (60.4 kg)  Height: 5' (1.524 m)   Body mass index is 26.01 kg/m. Ideal Body Weight: Weight in (lb) to  have BMI = 25: 127.7  GEN: WDWN,  NAD, Non-toxic, A & O x 3, looks well HEENT: Atraumatic, Normocephalic. Neck supple. No masses, No LAD. Ears and Nose: No external deformity. CV: RRR, No M/G/R. No JVD. No thrill. No extra heart sounds. PULM: CTA B, no wheezes, crackles, rhonchi. No retractions. No resp. distress. No accessory muscle use. ABD: S, NT, ND, +BS. No rebound. No HSM. EXTR: No c/c/e NEURO Normal gait.  PSYCH: Normally interactive. Conversant. Not depressed or anxious appearing.  Calm demeanor.  Breast: normal exam, no masses/ dimpling/ discharge Pelvic: normal, no vaginal lesions or discharge. Uterus normal, no CMT, no adnexal tendereness or masses Bruising on right posterior calf from recent injury - achilles and ankle are ok  Results for orders placed or performed in visit on 06/22/17  POCT urinalysis dipstick  Result Value Ref Range   Color, UA yellow yellow   Clarity, UA clear clear   Glucose, UA negative negative mg/dL   Bilirubin, UA negative negative   Ketones, POC UA negative negative mg/dL   Spec Grav, UA 1.015 1.010 - 1.025   Blood, UA negative negative   pH, UA 6.0 5.0 - 8.0   Protein Ur, POC negative negative mg/dL   Urobilinogen, UA 0.2 0.2 or 1.0 E.U./dL   Nitrite, UA Negative Negative   Leukocytes, UA Negative Negative    Assessment and Plan: Screening for cervical cancer - Plan: Cytology - PAP  Screening for colon cancer  Environmental and seasonal allergies - Plan: cetirizine (ZYRTEC) 10 MG tablet, azelastine (ASTELIN) 0.1 % nasal spray, fluticasone (FLONASE) 50 MCG/ACT nasal spray  Mild persistent asthma without complication - Plan: budesonide-formoterol (SYMBICORT) 80-4.5 MCG/ACT inhaler, albuterol (VENTOLIN HFA) 108 (90 Base) MCG/ACT inhaler  Screening for hyperlipidemia - Plan: Lipid panel  Screening for deficiency anemia - Plan: CBC  Screening for diabetes mellitus - Plan: Comprehensive metabolic panel, Hemoglobin A1c  Other fatigue -  Plan: TSH  Urinary frequency - Plan: Urine Culture, POCT urinalysis dipstick  Vitamin D deficiency - Plan: Vitamin D (25 hydroxy)  Seasonal allergies - Plan: budesonide-formoterol (SYMBICORT) 80-4.5 MCG/ACT inhaler  Here today as a new patient to me, but known to the practice. She had a CPE today Pap pending Ordered cologuard Continue annual mammogram Refilled medications Declined flu shot  Signed Lamar Blinks, MD  Received her labs 10/5- letter to pt  Results for orders placed or performed in visit on 06/22/17  Urine Culture  Result Value Ref Range   MICRO NUMBER: [id number]    SPECIMEN QUALITY: ADEQUATE    Sample Source NOT GIVEN    STATUS: FINAL    Result: No Growth   CBC  Result Value Ref Range   WBC 8.8 4.0 - 10.5 K/uL   RBC 4.05 3.87 - 5.11 Mil/uL   Platelets 398.0 150.0 - 400.0 K/uL   Hemoglobin 13.0 12.0 - 15.0 g/dL   HCT 38.2 36.0 - 46.0 %   MCV 94.4 78.0 - 100.0 fl   MCHC 34.1 30.0 - 36.0 g/dL   RDW 12.6 11.5 - 15.5 %  Comprehensive metabolic panel  Result Value Ref Range   Sodium 138 135 - 145 mEq/L   Potassium 4.1 3.5 - 5.1 mEq/L   Chloride 104 96 - 112 mEq/L   CO2 28 19 - 32 mEq/L   Glucose, Bld 123 (H) 70 - 99 mg/dL   BUN 20 6 - 23 mg/dL   Creatinine, Ser 0.79 0.40 - 1.20 mg/dL   Total Bilirubin 0.4 0.2 - 1.2 mg/dL   Alkaline Phosphatase 54 39 -  117 U/L   AST 17 0 - 37 U/L   ALT 15 0 - 35 U/L   Total Protein 7.3 6.0 - 8.3 g/dL   Albumin 4.5 3.5 - 5.2 g/dL   Calcium 9.6 8.4 - 10.5 mg/dL   GFR 81.08 >60.00 mL/min  Lipid panel  Result Value Ref Range   Cholesterol 192 0 - 200 mg/dL   Triglycerides 103.0 0.0 - 149.0 mg/dL   HDL 78.50 >39.00 mg/dL   VLDL 20.6 0.0 - 40.0 mg/dL   LDL Cholesterol 93 0 - 99 mg/dL   Total CHOL/HDL Ratio 2    NonHDL 113.80   TSH  Result Value Ref Range   TSH 1.13 0.35 - 4.50 uIU/mL  Hemoglobin A1c  Result Value Ref Range   Hgb A1c MFr Bld 5.5 4.6 - 6.5 %  Vitamin D (25 hydroxy)  Result Value Ref Range   VITD  33.81 30.00 - 100.00 ng/mL  POCT urinalysis dipstick  Result Value Ref Range   Color, UA yellow yellow   Clarity, UA clear clear   Glucose, UA negative negative mg/dL   Bilirubin, UA negative negative   Ketones, POC UA negative negative mg/dL   Spec Grav, UA 1.015 1.010 - 1.025   Blood, UA negative negative   pH, UA 6.0 5.0 - 8.0   Protein Ur, POC negative negative mg/dL   Urobilinogen, UA 0.2 0.2 or 1.0 E.U./dL   Nitrite, UA Negative Negative   Leukocytes, UA Negative Negative  Cytology - PAP  Result Value Ref Range   Adequacy      Satisfactory for evaluation. The presence or absence of an endocervical / transformation zone component cannot be determined because of atrophy.   Diagnosis      NEGATIVE FOR INTRAEPITHELIAL LESIONS OR MALIGNANCY.   HPV NOT DETECTED    Material Submitted CervicoVaginal Pap [ThinPrep Imaged]    CYTOLOGY - PAP PAP RESULT

## 2017-06-22 ENCOUNTER — Other Ambulatory Visit (HOSPITAL_COMMUNITY)
Admission: RE | Admit: 2017-06-22 | Discharge: 2017-06-22 | Disposition: A | Discharge status: Home / Self Care | Payer: BLUE CROSS/BLUE SHIELD | Source: Ambulatory Visit | Attending: Family Medicine | Admitting: Family Medicine

## 2017-06-22 ENCOUNTER — Ambulatory Visit (INDEPENDENT_AMBULATORY_CARE_PROVIDER_SITE_OTHER): Payer: BLUE CROSS/BLUE SHIELD | Admitting: Family Medicine

## 2017-06-22 ENCOUNTER — Other Ambulatory Visit: Payer: Self-pay | Admitting: Emergency Medicine

## 2017-06-22 VITALS — BP 122/61 | HR 67 | Temp 98.0°F | Ht 60.0 in | Wt 133.2 lb

## 2017-06-22 DIAGNOSIS — Z0000 Encounter for general adult medical examination without abnormal findings: Secondary | ICD-10-CM | POA: Diagnosis not present

## 2017-06-22 DIAGNOSIS — Z130 Encounter for screening for diseases of the blood and blood-forming organs and certain disorders involving the immune mechanism: Secondary | ICD-10-CM

## 2017-06-22 DIAGNOSIS — Z1211 Encounter for screening for malignant neoplasm of colon: Secondary | ICD-10-CM

## 2017-06-22 DIAGNOSIS — R5383 Other fatigue: Secondary | ICD-10-CM

## 2017-06-22 DIAGNOSIS — J302 Other seasonal allergic rhinitis: Secondary | ICD-10-CM | POA: Diagnosis not present

## 2017-06-22 DIAGNOSIS — R350 Frequency of micturition: Secondary | ICD-10-CM | POA: Diagnosis not present

## 2017-06-22 DIAGNOSIS — E559 Vitamin D deficiency, unspecified: Secondary | ICD-10-CM | POA: Diagnosis not present

## 2017-06-22 DIAGNOSIS — J4530 Mild persistent asthma, uncomplicated: Secondary | ICD-10-CM | POA: Diagnosis not present

## 2017-06-22 DIAGNOSIS — Z124 Encounter for screening for malignant neoplasm of cervix: Secondary | ICD-10-CM | POA: Diagnosis not present

## 2017-06-22 DIAGNOSIS — J3089 Other allergic rhinitis: Secondary | ICD-10-CM

## 2017-06-22 DIAGNOSIS — Z13220 Encounter for screening for lipoid disorders: Secondary | ICD-10-CM

## 2017-06-22 DIAGNOSIS — Z131 Encounter for screening for diabetes mellitus: Secondary | ICD-10-CM | POA: Diagnosis not present

## 2017-06-22 LAB — COMPREHENSIVE METABOLIC PANEL
ALBUMIN: 4.5 g/dL (ref 3.5–5.2)
ALK PHOS: 54 U/L (ref 39–117)
ALT: 15 U/L (ref 0–35)
AST: 17 U/L (ref 0–37)
BILIRUBIN TOTAL: 0.4 mg/dL (ref 0.2–1.2)
BUN: 20 mg/dL (ref 6–23)
CO2: 28 mEq/L (ref 19–32)
Calcium: 9.6 mg/dL (ref 8.4–10.5)
Chloride: 104 mEq/L (ref 96–112)
Creatinine, Ser: 0.79 mg/dL (ref 0.40–1.20)
GFR: 81.08 mL/min (ref >60.00)
GLUCOSE: 123 mg/dL — AB (ref 70–99)
Potassium: 4.1 mEq/L (ref 3.5–5.1)
Sodium: 138 mEq/L (ref 135–145)
TOTAL PROTEIN: 7.3 g/dL (ref 6.0–8.3)

## 2017-06-22 LAB — POCT URINALYSIS DIP (MANUAL ENTRY)
Bilirubin, UA: NEGATIVE
Glucose, UA: NEGATIVE mg/dL
Ketones, POC UA: NEGATIVE mg/dL
Leukocytes, UA: NEGATIVE
Nitrite, UA: NEGATIVE
PROTEIN UA: NEGATIVE mg/dL
RBC UA: NEGATIVE
SPEC GRAV UA: 1.015 (ref 1.010–1.025)
UROBILINOGEN UA: 0.2 U/dL
pH, UA: 6 (ref 5.0–8.0)

## 2017-06-22 LAB — CBC
HCT: 38.2 % (ref 36.0–46.0)
HEMOGLOBIN: 13 g/dL (ref 12.0–15.0)
MCHC: 34.1 g/dL (ref 30.0–36.0)
MCV: 94.4 fl (ref 78.0–100.0)
Platelets: 398 10*3/uL (ref 150.0–400.0)
RBC: 4.05 Mil/uL (ref 3.87–5.11)
RDW: 12.6 % (ref 11.5–15.5)
WBC: 8.8 10*3/uL (ref 4.0–10.5)

## 2017-06-22 LAB — LIPID PANEL
Cholesterol: 192 mg/dL (ref 0–200)
HDL: 78.5 mg/dL (ref >39.00)
LDL Cholesterol: 93 mg/dL (ref 0–99)
NONHDL: 113.8
Total CHOL/HDL Ratio: 2
Triglycerides: 103 mg/dL (ref 0.0–149.0)
VLDL: 20.6 mg/dL (ref 0.0–40.0)

## 2017-06-22 LAB — HEMOGLOBIN A1C: Hgb A1c MFr Bld: 5.5 % (ref 4.6–6.5)

## 2017-06-22 LAB — VITAMIN D 25 HYDROXY (VIT D DEFICIENCY, FRACTURES): VITD: 33.81 ng/mL (ref 30.00–100.00)

## 2017-06-22 LAB — TSH: TSH: 1.13 u[IU]/mL (ref 0.35–4.50)

## 2017-06-22 MED ORDER — AZELASTINE HCL 0.1 % NA SOLN: 1.0000 | Freq: Two times a day (BID) | NASAL | 11 refills | Status: DC

## 2017-06-22 MED ORDER — ALBUTEROL SULFATE HFA 108 (90 BASE) MCG/ACT IN AERS: INHALATION_SPRAY | RESPIRATORY_TRACT | 6 refills | Status: DC

## 2017-06-22 MED ORDER — BUDESONIDE-FORMOTEROL FUMARATE 80-4.5 MCG/ACT IN AERO: 2.0000 | INHALATION_SPRAY | Freq: Two times a day (BID) | RESPIRATORY_TRACT | 3 refills | Status: DC

## 2017-06-22 MED ORDER — FLUTICASONE PROPIONATE 50 MCG/ACT NA SUSP: NASAL | 11 refills | Status: DC

## 2017-06-22 MED ORDER — CETIRIZINE HCL 10 MG PO TABS: ORAL_TABLET | ORAL | 3 refills | Status: DC

## 2017-06-22 NOTE — Patient Instructions (Addendum)
It was very nice to meet you today!  I will be in touch with your labs and pap asap I do want to get you set up for cologuard- we will order this for you today   Take care!   Health Maintenance, Female Adopting a healthy lifestyle and getting preventive care can go a long way to promote health and wellness. Talk with your health care provider about what schedule of regular examinations is right for you. This is a good chance for you to check in with your provider about disease prevention and staying healthy. In between checkups, there are plenty of things you can do on your own. Experts have done a lot of research about which lifestyle changes and preventive measures are most likely to keep you healthy. Ask your health care provider for more information. Weight and diet Eat a healthy diet  Be sure to include plenty of vegetables, fruits, low-fat dairy products, and lean protein.  Do not eat a lot of foods high in solid fats, added sugars, or salt.  Get regular exercise. This is one of the most important things you can do for your health. ? Most adults should exercise for at least 150 minutes each week. The exercise should increase your heart rate and make you sweat (moderate-intensity exercise). ? Most adults should also do strengthening exercises at least twice a week. This is in addition to the moderate-intensity exercise.  Maintain a healthy weight  Body mass index (BMI) is a measurement that can be used to identify possible weight problems. It estimates body fat based on height and weight. Your health care provider can help determine your BMI and help you achieve or maintain a healthy weight.  For females 84 years of age and older: ? A BMI below 18.5 is considered underweight. ? A BMI of 18.5 to 24.9 is normal. ? A BMI of 25 to 29.9 is considered overweight. ? A BMI of 30 and above is considered obese.  Watch levels of cholesterol and blood lipids  You should start having your  blood tested for lipids and cholesterol at 52 years of age, then have this test every 5 years.  You may need to have your cholesterol levels checked more often if: ? Your lipid or cholesterol levels are high. ? You are older than 52 years of age. ? You are at high risk for heart disease.  Cancer screening Lung Cancer  Lung cancer screening is recommended for adults 44-48 years old who are at high risk for lung cancer because of a history of smoking.  A yearly low-dose CT scan of the lungs is recommended for people who: ? Currently smoke. ? Have quit within the past 15 years. ? Have at least a 30-pack-year history of smoking. A pack year is smoking an average of one pack of cigarettes a day for 1 year.  Yearly screening should continue until it has been 15 years since you quit.  Yearly screening should stop if you develop a health problem that would prevent you from having lung cancer treatment.  Breast Cancer  Practice breast self-awareness. This means understanding how your breasts normally appear and feel.  It also means doing regular breast self-exams. Let your health care provider know about any changes, no matter how small.  If you are in your 20s or 30s, you should have a clinical breast exam (CBE) by a health care provider every 1-3 years as part of a regular health exam.  If you are  20 or older, have a CBE every year. Also consider having a breast X-ray (mammogram) every year.  If you have a family history of breast cancer, talk to your health care provider about genetic screening.  If you are at high risk for breast cancer, talk to your health care provider about having an MRI and a mammogram every year.  Breast cancer gene (BRCA) assessment is recommended for women who have family members with BRCA-related cancers. BRCA-related cancers include: ? Breast. ? Ovarian. ? Tubal. ? Peritoneal cancers.  Results of the assessment will determine the need for genetic  counseling and BRCA1 and BRCA2 testing.  Cervical Cancer Your health care provider may recommend that you be screened regularly for cancer of the pelvic organs (ovaries, uterus, and vagina). This screening involves a pelvic examination, including checking for microscopic changes to the surface of your cervix (Pap test). You may be encouraged to have this screening done every 3 years, beginning at age 59.  For women ages 69-65, health care providers may recommend pelvic exams and Pap testing every 3 years, or they may recommend the Pap and pelvic exam, combined with testing for human papilloma virus (HPV), every 5 years. Some types of HPV increase your risk of cervical cancer. Testing for HPV may also be done on women of any age with unclear Pap test results.  Other health care providers may not recommend any screening for nonpregnant women who are considered low risk for pelvic cancer and who do not have symptoms. Ask your health care provider if a screening pelvic exam is right for you.  If you have had past treatment for cervical cancer or a condition that could lead to cancer, you need Pap tests and screening for cancer for at least 20 years after your treatment. If Pap tests have been discontinued, your risk factors (such as having a new sexual partner) need to be reassessed to determine if screening should resume. Some women have medical problems that increase the chance of getting cervical cancer. In these cases, your health care provider may recommend more frequent screening and Pap tests.  Colorectal Cancer  This type of cancer can be detected and often prevented.  Routine colorectal cancer screening usually begins at 52 years of age and continues through 52 years of age.  Your health care provider may recommend screening at an earlier age if you have risk factors for colon cancer.  Your health care provider may also recommend using home test kits to check for hidden blood in the  stool.  A small camera at the end of a tube can be used to examine your colon directly (sigmoidoscopy or colonoscopy). This is done to check for the earliest forms of colorectal cancer.  Routine screening usually begins at age 22.  Direct examination of the colon should be repeated every 5-10 years through 52 years of age. However, you may need to be screened more often if early forms of precancerous polyps or small growths are found.  Skin Cancer  Check your skin from head to toe regularly.  Tell your health care provider about any new moles or changes in moles, especially if there is a change in a mole's shape or color.  Also tell your health care provider if you have a mole that is larger than the size of a pencil eraser.  Always use sunscreen. Apply sunscreen liberally and repeatedly throughout the day.  Protect yourself by wearing long sleeves, pants, a wide-brimmed hat, and sunglasses whenever you  are outside.  Heart disease, diabetes, and high blood pressure  High blood pressure causes heart disease and increases the risk of stroke. High blood pressure is more likely to develop in: ? People who have blood pressure in the high end of the normal range (130-139/85-89 mm Hg). ? People who are overweight or obese. ? People who are African American.  If you are 70-3 years of age, have your blood pressure checked every 3-5 years. If you are 15 years of age or older, have your blood pressure checked every year. You should have your blood pressure measured twice-once when you are at a hospital or clinic, and once when you are not at a hospital or clinic. Record the average of the two measurements. To check your blood pressure when you are not at a hospital or clinic, you can use: ? An automated blood pressure machine at a pharmacy. ? A home blood pressure monitor.  If you are between 40 years and 58 years old, ask your health care provider if you should take aspirin to prevent  strokes.  Have regular diabetes screenings. This involves taking a blood sample to check your fasting blood sugar level. ? If you are at a normal weight and have a low risk for diabetes, have this test once every three years after 52 years of age. ? If you are overweight and have a high risk for diabetes, consider being tested at a younger age or more often. Preventing infection Hepatitis B  If you have a higher risk for hepatitis B, you should be screened for this virus. You are considered at high risk for hepatitis B if: ? You were born in a country where hepatitis B is common. Ask your health care provider which countries are considered high risk. ? Your parents were born in a high-risk country, and you have not been immunized against hepatitis B (hepatitis B vaccine). ? You have HIV or AIDS. ? You use needles to inject street drugs. ? You live with someone who has hepatitis B. ? You have had sex with someone who has hepatitis B. ? You get hemodialysis treatment. ? You take certain medicines for conditions, including cancer, organ transplantation, and autoimmune conditions.  Hepatitis C  Blood testing is recommended for: ? Everyone born from [birth year] through 1966. ? Anyone with known risk factors for hepatitis C.  Sexually transmitted infections (STIs)  You should be screened for sexually transmitted infections (STIs) including gonorrhea and chlamydia if: ? You are sexually active and are younger than 52 years of age. ? You are older than 52 years of age and your health care provider tells you that you are at risk for this type of infection. ? Your sexual activity has changed since you were last screened and you are at an increased risk for chlamydia or gonorrhea. Ask your health care provider if you are at risk.  If you do not have HIV, but are at risk, it may be recommended that you take a prescription medicine daily to prevent HIV infection. This is called pre-exposure prophylaxis  (PrEP). You are considered at risk if: ? You are sexually active and do not regularly use condoms or know the HIV status of your partner(s). ? You take drugs by injection. ? You are sexually active with a partner who has HIV.  Talk with your health care provider about whether you are at high risk of being infected with HIV. If you choose to begin PrEP, you should first be  tested for HIV. You should then be tested every 3 months for as long as you are taking PrEP. Pregnancy  If you are premenopausal and you may become pregnant, ask your health care provider about preconception counseling.  If you may become pregnant, take 400 to 800 micrograms (mcg) of folic acid every day.  If you want to prevent pregnancy, talk to your health care provider about birth control (contraception). Osteoporosis and menopause  Osteoporosis is a disease in which the bones lose minerals and strength with aging. This can result in serious bone fractures. Your risk for osteoporosis can be identified using a bone density scan.  If you are 26 years of age or older, or if you are at risk for osteoporosis and fractures, ask your health care provider if you should be screened.  Ask your health care provider whether you should take a calcium or vitamin D supplement to lower your risk for osteoporosis.  Menopause may have certain physical symptoms and risks.  Hormone replacement therapy may reduce some of these symptoms and risks. Talk to your health care provider about whether hormone replacement therapy is right for you. Follow these instructions at home:  Schedule regular health, dental, and eye exams.  Stay current with your immunizations.  Do not use any tobacco products including cigarettes, chewing tobacco, or electronic cigarettes.  If you are pregnant, do not drink alcohol.  If you are breastfeeding, limit how much and how often you drink alcohol.  Limit alcohol intake to no more than 1 drink per day for  nonpregnant women. One drink equals 12 ounces of beer, 5 ounces of wine, or 1 ounces of hard liquor.  Do not use street drugs.  Do not share needles.  Ask your health care provider for help if you need support or information about quitting drugs.  Tell your health care provider if you often feel depressed.  Tell your health care provider if you have ever been abused or do not feel safe at home. This information is not intended to replace advice given to you by your health care provider. Make sure you discuss any questions you have with your health care provider. Document Released: 03/22/2011 Document Revised: 02/12/2016 Document Reviewed: 06/10/2015 Elsevier Interactive Patient Education  [employer].

## 2017-06-23 LAB — URINE CULTURE
MICRO NUMBER:: 81098161
Result:: NO GROWTH
SPECIMEN QUALITY:: ADEQUATE

## 2017-06-24 LAB — CYTOLOGY - PAP
DIAGNOSIS: NEGATIVE
HPV: NOT DETECTED

## 2017-07-04 ENCOUNTER — Other Ambulatory Visit: Payer: Self-pay | Admitting: Physician Assistant

## 2017-10-06 ENCOUNTER — Ambulatory Visit: Payer: BLUE CROSS/BLUE SHIELD | Admitting: Family Medicine

## 2017-10-06 ENCOUNTER — Encounter: Payer: Self-pay | Admitting: Family Medicine

## 2017-10-06 ENCOUNTER — Telehealth: Payer: Self-pay

## 2017-10-06 ENCOUNTER — Other Ambulatory Visit: Payer: Self-pay | Admitting: *Deleted

## 2017-10-06 ENCOUNTER — Other Ambulatory Visit: Payer: Self-pay | Admitting: Emergency Medicine

## 2017-10-06 ENCOUNTER — Telehealth: Payer: Self-pay | Admitting: Family Medicine

## 2017-10-06 VITALS — BP 118/60 | HR 59 | Temp 97.9°F | Ht 60.0 in | Wt 133.5 lb

## 2017-10-06 DIAGNOSIS — J069 Acute upper respiratory infection, unspecified: Secondary | ICD-10-CM | POA: Diagnosis not present

## 2017-10-06 DIAGNOSIS — J4530 Mild persistent asthma, uncomplicated: Secondary | ICD-10-CM

## 2017-10-06 DIAGNOSIS — J302 Other seasonal allergic rhinitis: Secondary | ICD-10-CM

## 2017-10-06 DIAGNOSIS — H6591 Unspecified nonsuppurative otitis media, right ear: Secondary | ICD-10-CM

## 2017-10-06 MED ORDER — MONTELUKAST SODIUM 10 MG PO TABS: 10.0000 mg | ORAL_TABLET | Freq: Every day | ORAL | 3 refills | Status: DC

## 2017-10-06 MED ORDER — PREDNISONE 20 MG PO TABS: 40.0000 mg | ORAL_TABLET | Freq: Every day | ORAL | 0 refills | Status: AC

## 2017-10-06 NOTE — Progress Notes (Signed)
Chief Complaint  Patient presents with  . Sinusitis    Jane Spencer here for URI complaints.  Duration: 3 days  Associated symptoms: sinus congestion, rhinorrhea, ear pain, wheezing, shortness of breath and slight cough Denies: itchy watery eyes, ear drainage, sore throat, myalgia and fevers/rigors Treatment to date: allergy meds Sick contacts: No  ROS:  Const: Denies fevers HEENT: As noted in HPI Lungs: No SOB  Past Medical History:  Diagnosis Date  . Allergy   . Asthma   . Environmental allergies   . Plantar fasciitis    Bilateral  . Seasonal allergies    BP 118/60 (BP Location: Left Arm, Patient Position: Sitting, Cuff Size: Normal)   Pulse (!) 59   Temp 97.9 F (36.6 C) (Oral)   Ht 5' (1.524 m)   Wt 133 lb 8 oz (60.6 kg)   SpO2 94%   BMI 26.07 kg/m  General: Awake, alert, appears stated age HEENT: AT, [place], ears patent b/l and TM neg on R, serous fluid, mild bulging on R, no erythema, nares patent w/o discharge, pharynx pink and without exudates, MMM Neck: No masses or asymmetry Heart: RRR Lungs: CTAB, no accessory muscle use Psych: Age appropriate judgment and insight, normal mood and affect  Fluid level behind tympanic membrane of right ear - Plan: predniSONE (DELTASONE) 20 MG tablet  Viral URI  Orders as above. Continue to push fluids, practice good hand hygiene, cover mouth when coughing. F/u prn. If starting to experience fevers, shaking, or shortness of breath, seek immediate care. Pt voiced understanding and agreement to the plan.  Jilda Rocheicholas Paul [place], DO 10/06/17 1:50 PM

## 2017-10-06 NOTE — Telephone Encounter (Signed)
Rx refilled per protocol- patient was seen 06/22/17

## 2017-10-06 NOTE — Telephone Encounter (Signed)
Copied from CRM [phone]. Topic: General - Other >> Oct 06, 2017  3:45 PM Viviann SpareWhite, Selina wrote: Reason for CRM: Patient husband call to updated insurance info:   [employer] Sub[employer] Id: [id number] Group Id: [id number] RX Bin: [id number]  Patient husband stated that he can make a copy of the insurance card and send it in.

## 2017-10-06 NOTE — Telephone Encounter (Signed)
Copied from CRM [phone]. Topic: Quick Communication - Rx Refill/Question >> Oct 06, 2017  2:31 PM Oneal GroutSebastian, Jennifer S wrote: Medication: montelukast (SINGULAIR) 10 MG tablet    Has the patient contacted their pharmacy? Yes.     (Agent: If no, request that the patient contact the pharmacy for the refill.)   Preferred Pharmacy (with phone number or street name): [facility] [address]   Agent: Please be advised that RX refills may take up to 3 business days. We ask that you follow-up with your pharmacy.

## 2017-10-06 NOTE — Patient Instructions (Addendum)
Continue to push fluids, practice good hand hygiene, and cover your mouth if you cough.  If you start having fevers, shaking or shortness of breath, seek immediate care.  OK to take Tylenol 1000 mg (2 extra strength tabs) or 975 mg (3 regular strength tabs) every 6 hours as needed.  Hold off on ibuprofen while on steroid.  Let us know if you need anything.

## 2017-10-06 NOTE — Progress Notes (Signed)
Pre visit review using our clinic review tool, if applicable. No additional management support is needed unless otherwise documented below in the visit note. 

## 2017-10-12 ENCOUNTER — Ambulatory Visit: Payer: BLUE CROSS/BLUE SHIELD | Admitting: Family Medicine

## 2017-10-12 ENCOUNTER — Encounter: Payer: Self-pay | Admitting: Family Medicine

## 2017-10-12 VITALS — BP 112/70 | HR 63 | Temp 98.0°F | Ht 61.0 in | Wt 136.0 lb

## 2017-10-12 DIAGNOSIS — J0100 Acute maxillary sinusitis, unspecified: Secondary | ICD-10-CM | POA: Diagnosis not present

## 2017-10-12 MED ORDER — AMOXICILLIN-POT CLAVULANATE 875-125 MG PO TABS
1.0000 | ORAL_TABLET | Freq: Two times a day (BID) | ORAL | 0 refills | Status: DC
Start: 2017-10-12 — End: 2018-08-03

## 2017-10-12 NOTE — Patient Instructions (Addendum)
Most sinus infections are viral in etiology and antibiotics will not be helpful. That being said, if you start having worsening symptoms over 3 days, you are worsening by day 10 or not improving by day 14, go ahead and take it. You are on Day 9 as of now.   Continue to push fluids, practice good hand hygiene, and cover your mouth if you cough.  If you start having fevers, shaking or shortness of breath, seek immediate care.  Ibuprofen 400-600 mg (2-3 over the counter strength tabs) every 6 hours as needed for pain.  OK to take Tylenol 1000 mg (2 extra strength tabs) or 975 mg (3 regular strength tabs) every 6 hours as needed.  Let us know if you need anything.

## 2017-10-12 NOTE — Progress Notes (Signed)
Pre visit review using our clinic review tool, if applicable. No additional management support is needed unless otherwise documented below in the visit note. 

## 2017-10-12 NOTE — Progress Notes (Signed)
Chief Complaint  Patient presents with  . Sinusitis  . Shortness of Breath    Jane Spencer here for URI complaints.  Duration: 9 days; worsened Associated symptoms: sinus congestion, sinus pain, rhinorrhea and sore throat Denies: ear pain, ear drainage, wheezing, shortness of breath, myalgia and fevers/rigors, cough Treatment to date: Prednisone, Tylenol, usual meds Sick contacts: No  ROS:  Const: Denies fevers HEENT: As noted in HPI Lungs: No SOB  Past Medical History:  Diagnosis Date  . Allergy   . Asthma   . Environmental allergies   . Plantar fasciitis    Bilateral  . Seasonal allergies    BP 112/70 (BP Location: Left Arm, Patient Position: Sitting, Cuff Size: Normal)   Pulse 63   Temp 98 F (36.7 C) (Oral)   Ht 5\' 1"  (1.549 m)   Wt 136 lb (61.7 kg)   SpO2 95%   BMI 25.70 kg/m  General: Awake, alert, appears stated age HEENT: AT, [place], ears patent b/l and TM's neg, R max sinus ttp, nares patent w/o discharge, turb edema on R, pharynx pink and without exudates, MMM Neck: No masses or asymmetry Heart: RRR Lungs: CTAB, no accessory muscle use Psych: Age appropriate judgment and insight, normal mood and affect  Acute non-recurrent maxillary sinusitis - Plan: amoxicillin-clavulanate (AUGMENTIN) 875-125 MG tablet  Orders as above. Pocket rx info given. Ear effusion appears to have resolved.  Continue to push fluids, practice good hand hygiene, cover mouth when coughing. F/u prn. If starting to experience fevers, shaking, or shortness of breath, seek immediate care. Pt voiced understanding and agreement to the plan.  Jilda Rocheicholas Paul [place], DO 10/12/17 11:52 AM

## 2018-01-24 ENCOUNTER — Other Ambulatory Visit: Payer: Self-pay | Admitting: Family Medicine

## 2018-01-24 DIAGNOSIS — J4530 Mild persistent asthma, uncomplicated: Secondary | ICD-10-CM

## 2018-02-12 ENCOUNTER — Other Ambulatory Visit: Payer: Self-pay | Admitting: Family Medicine

## 2018-02-12 DIAGNOSIS — J4530 Mild persistent asthma, uncomplicated: Secondary | ICD-10-CM

## 2018-02-12 DIAGNOSIS — J302 Other seasonal allergic rhinitis: Secondary | ICD-10-CM

## 2018-02-14 ENCOUNTER — Other Ambulatory Visit: Payer: Self-pay | Admitting: Family Medicine

## 2018-02-14 DIAGNOSIS — J4530 Mild persistent asthma, uncomplicated: Secondary | ICD-10-CM

## 2018-05-26 ENCOUNTER — Other Ambulatory Visit: Payer: Self-pay | Admitting: Family Medicine

## 2018-05-26 DIAGNOSIS — J4530 Mild persistent asthma, uncomplicated: Secondary | ICD-10-CM

## 2018-06-05 ENCOUNTER — Other Ambulatory Visit: Payer: Self-pay | Admitting: Family Medicine

## 2018-06-05 DIAGNOSIS — J4530 Mild persistent asthma, uncomplicated: Secondary | ICD-10-CM

## 2018-06-05 DIAGNOSIS — J302 Other seasonal allergic rhinitis: Secondary | ICD-10-CM

## 2018-06-26 ENCOUNTER — Other Ambulatory Visit: Payer: Self-pay | Admitting: Family Medicine

## 2018-06-26 DIAGNOSIS — J4530 Mild persistent asthma, uncomplicated: Secondary | ICD-10-CM

## 2018-06-27 ENCOUNTER — Other Ambulatory Visit: Payer: Self-pay | Admitting: Family Medicine

## 2018-06-27 DIAGNOSIS — J3089 Other allergic rhinitis: Secondary | ICD-10-CM

## 2018-06-28 ENCOUNTER — Telehealth: Payer: Self-pay | Admitting: *Deleted

## 2018-06-28 NOTE — Telephone Encounter (Signed)
Received Cologuard Order Cancellation: [id number]; order has been Cancelled because it has been Inactive, and has exceeded the 365 days from the Initial order; forwarded to provider/SLS 10/09

## 2018-07-09 ENCOUNTER — Other Ambulatory Visit: Payer: Self-pay | Admitting: Family Medicine

## 2018-07-09 DIAGNOSIS — J4530 Mild persistent asthma, uncomplicated: Secondary | ICD-10-CM

## 2018-07-09 DIAGNOSIS — J302 Other seasonal allergic rhinitis: Secondary | ICD-10-CM

## 2018-07-27 ENCOUNTER — Other Ambulatory Visit: Payer: Self-pay | Admitting: Family Medicine

## 2018-07-27 DIAGNOSIS — J3089 Other allergic rhinitis: Secondary | ICD-10-CM

## 2018-08-01 ENCOUNTER — Other Ambulatory Visit: Payer: Self-pay | Admitting: Family Medicine

## 2018-08-01 DIAGNOSIS — J4530 Mild persistent asthma, uncomplicated: Secondary | ICD-10-CM

## 2018-08-03 ENCOUNTER — Ambulatory Visit: Payer: BLUE CROSS/BLUE SHIELD | Admitting: Family Medicine

## 2018-08-03 ENCOUNTER — Encounter: Payer: Self-pay | Admitting: Family Medicine

## 2018-08-03 VITALS — BP 126/80 | HR 55 | Resp 16 | Ht 61.0 in | Wt 133.4 lb

## 2018-08-03 DIAGNOSIS — J339 Nasal polyp, unspecified: Secondary | ICD-10-CM

## 2018-08-03 DIAGNOSIS — J3089 Other allergic rhinitis: Secondary | ICD-10-CM

## 2018-08-03 DIAGNOSIS — Z13220 Encounter for screening for lipoid disorders: Secondary | ICD-10-CM | POA: Diagnosis not present

## 2018-08-03 DIAGNOSIS — Z131 Encounter for screening for diabetes mellitus: Secondary | ICD-10-CM

## 2018-08-03 DIAGNOSIS — J4530 Mild persistent asthma, uncomplicated: Secondary | ICD-10-CM

## 2018-08-03 DIAGNOSIS — Z1211 Encounter for screening for malignant neoplasm of colon: Secondary | ICD-10-CM

## 2018-08-03 DIAGNOSIS — J302 Other seasonal allergic rhinitis: Secondary | ICD-10-CM | POA: Diagnosis not present

## 2018-08-03 DIAGNOSIS — Z130 Encounter for screening for diseases of the blood and blood-forming organs and certain disorders involving the immune mechanism: Secondary | ICD-10-CM

## 2018-08-03 MED ORDER — MONTELUKAST SODIUM 10 MG PO TABS: 10.0000 mg | ORAL_TABLET | Freq: Every day | ORAL | 3 refills | Status: DC

## 2018-08-03 MED ORDER — FLUTICASONE PROPIONATE 50 MCG/ACT NA SUSP: NASAL | 11 refills | Status: DC

## 2018-08-03 MED ORDER — CETIRIZINE HCL 10 MG PO TABS: ORAL_TABLET | ORAL | 3 refills | Status: DC

## 2018-08-03 MED ORDER — BUDESONIDE-FORMOTEROL FUMARATE 80-4.5 MCG/ACT IN AERO: 2.0000 | INHALATION_SPRAY | Freq: Two times a day (BID) | RESPIRATORY_TRACT | 3 refills | Status: DC

## 2018-08-03 MED ORDER — AZELASTINE HCL 0.1 % NA SOLN: 1.0000 | Freq: Two times a day (BID) | NASAL | 11 refills | Status: DC

## 2018-08-03 MED ORDER — ALBUTEROL SULFATE HFA 108 (90 BASE) MCG/ACT IN AERS: INHALATION_SPRAY | RESPIRATORY_TRACT | 11 refills | Status: DC

## 2018-08-03 NOTE — Progress Notes (Addendum)
[facility] [address], [address] [place], [place] [address] [phone] [phone]  Date:  08/03/2018   Name:  Jane Spencer   DOB:  Jun 02, 1966   MRN:  [id number]  PCP:  Pearline C[place]opland, Einer Meals C, MD    Chief Complaint: Medication Refill (no flu shot)   History of Present Illness:  Jane Spencer is a 52 y.o. very pleasant female patient who presents with the following: Here today for medication refills -she has been doing well generally over the last year She will notice some burning in her right sided nasal passage just generally if she leans over She is using both zyrtec and singulair,and also flonase, astelin She also needs to refill her symbicort and albuterol She uses symbicort every day, albuterol prn  Her last mammo was about a year ago per her report- encouraged her to catch up on this test  She has not been screened for colon cancer as of yet, no family history, wishes to use cologurad  She declines a flu shot Pap is UTD   It has been a very busy year. Her mother passed away at 52 yo, and she bought a new house  Will do routine labs for her today  Patient Active Problem List   Diagnosis Date Noted  . Nasal polyp - anterior 01/02/2016  . Tobacco use disorder 12/22/2015  . Asthma, mild persistent 02/26/2015    Past Medical History:  Diagnosis Date  . Allergy   . Asthma   . Environmental allergies   . Plantar fasciitis    Bilateral  . Seasonal allergies     Past Surgical History:  Procedure Laterality Date  . CESAREAN SECTION    . FOOT SURGERY     Right  . TONSILLECTOMY      Social History   Tobacco Use  . Smoking status: Current Some Day Smoker    Packs/day: 0.10    Types: Cigarettes  . Smokeless tobacco: Never Used  . Tobacco comment: 1 cigarette per day  Substance Use Topics  . Alcohol use: Yes    Alcohol/week: 8.0 standard drinks    Types: 8 Standard drinks or equivalent per week  . Drug use: No     Family History  Problem Relation Age of Onset  . Stroke Father 2589       Deceased  . Other Father        Borderline Diabetes  . Macular degeneration Mother        Living  . Vascular Disease Mother   . Lung cancer Maternal Uncle        #1  . Heart attack Maternal Uncle        Deceased at 7242, #2  . Skin cancer Maternal Uncle        #1  . HIV/AIDS Brother        #1  . Healthy Brother        #2  . Healthy Sister        x2  . Heart defect Son        Bicuspid Valve  . Healthy Son        #2  . Healthy Daughter        x1  . Allergies Son        #1    No Known Allergies  Medication list has been reviewed and updated.  No current outpatient medications on file prior to visit.   No current facility-administered  medications on file prior to visit.     Review of Systems:  As per HPI- otherwise negative. No fever or chills    Physical Examination: Vitals:   08/03/18 1651  BP: 126/80  Pulse: (!) 55  Resp: 16  SpO2: 99%   Vitals:   08/03/18 1651  Weight: 133 lb 6.4 oz (60.5 kg)  Height: 5\' 1"  (1.549 m)   Body mass index is 25.21 kg/m. Ideal Body Weight: Weight in (lb) to have BMI = 25: 132  GEN: WDWN, NAD, Non-toxic, A & O x 3,normal weight, looks well  HEENT: Atraumatic, Normocephalic. Neck supple. No masses, No LAD. Ears and Nose: No external deformity. CV: RRR, No M/G/R. No JVD. No thrill. No extra heart sounds. PULM: CTA B, no wheezes, crackles, rhonchi. No retractions. No resp. distress. No accessory muscle use. ABD: S, NT, ND, +BS. No rebound. No HSM. EXTR: No c/c/e NEURO Normal gait.  PSYCH: Normally interactive. Conversant. Not depressed or anxious appearing.  Calm demeanor.  Right sided nasal polyp She also has nasal inflammation bilaterally but more on the left   Assessment and Plan: Seasonal allergic rhinitis, unspecified trigger - Plan: cetirizine (ZYRTEC) 10 MG tablet, montelukast (SINGULAIR) 10 MG tablet  Mild persistent asthma without  complication - Plan: montelukast (SINGULAIR) 10 MG tablet, albuterol (VENTOLIN HFA) 108 (90 Base) MCG/ACT inhaler, budesonide-formoterol (SYMBICORT) 80-4.5 MCG/ACT inhaler  Environmental and seasonal allergies - Plan: azelastine (ASTELIN) 0.1 % nasal spray, fluticasone (FLONASE) 50 MCG/ACT nasal spray  Seasonal allergies - Plan: budesonide-formoterol (SYMBICORT) 80-4.5 MCG/ACT inhaler  Screening for hyperlipidemia - Plan: Lipid panel  Screening for deficiency anemia - Plan: CBC  Screening for diabetes mellitus - Plan: Comprehensive metabolic panel, Hemoglobin A1c  Nasal polyp - Plan: Ambulatory referral to ENT  Screening for colon cancer  Following up today Labs ordered Refills done Ordered cologuard for her today Referral to ENT  Encouraged mammo   Signed Abbe Amsterdam, MD  Received her labs 11/15  Results for orders placed or performed in visit on 08/03/18  CBC  Result Value Ref Range   WBC 8.9 4.0 - 10.5 K/uL   RBC 4.14 3.87 - 5.11 Mil/uL   Platelets 425.0 (H) 150.0 - 400.0 K/uL   Hemoglobin 13.2 12.0 - 15.0 g/dL   HCT 95.2 84.1 - 32.4 %   MCV 93.9 78.0 - 100.0 fl   MCHC 33.9 30.0 - 36.0 g/dL   RDW 40.1 02.7 - 25.3 %  Comprehensive metabolic panel  Result Value Ref Range   Sodium 138 135 - 145 mEq/L   Potassium 4.8 3.5 - 5.1 mEq/L   Chloride 103 96 - 112 mEq/L   CO2 27 19 - 32 mEq/L   Glucose, Bld 95 70 - 99 mg/dL   BUN 18 6 - 23 mg/dL   Creatinine, Ser 6.64 0.40 - 1.20 mg/dL   Total Bilirubin 0.5 0.2 - 1.2 mg/dL   Alkaline Phosphatase 58 39 - 117 U/L   AST 22 0 - 37 U/L   ALT 15 0 - 35 U/L   Total Protein 7.2 6.0 - 8.3 g/dL   Albumin 4.8 3.5 - 5.2 g/dL   Calcium 9.7 8.4 - 40.3 mg/dL   GFR 47.42 >59.56 mL/min  Hemoglobin A1c  Result Value Ref Range   Hgb A1c MFr Bld 5.5 4.6 - 6.5 %  Lipid panel  Result Value Ref Range   Cholesterol 208 (H) 0 - 200 mg/dL   Triglycerides 38.7 0.0 - 149.0 mg/dL   HDL  78.30 >39.00 mg/dL   VLDL 16.1 0.0 - 09.6 mg/dL    LDL Cholesterol 045 (H) 0 - 99 mg/dL   Total CHOL/HDL Ratio 3    NonHDL 129.73    Letter to pt  The 10-year ASCVD risk score Denman George DC Jr., et al., 2013) is: 3%   Values used to calculate the score:     Age: 89 years     Sex: Female     Is Non-Hispanic African American: No     Diabetic: No     Tobacco smoker: Yes     Systolic Blood Pressure: 126 mmHg     Is BP treated: No     HDL Cholesterol: 78.3 mg/dL     Total Cholesterol: 208 mg/dL

## 2018-08-03 NOTE — Patient Instructions (Signed)
I will be in touch with your labs and will also contact you with your cologuard report asap  Referral to ENT made today

## 2018-08-04 LAB — COMPREHENSIVE METABOLIC PANEL
ALBUMIN: 4.8 g/dL (ref 3.5–5.2)
ALT: 15 U/L (ref 0–35)
AST: 22 U/L (ref 0–37)
Alkaline Phosphatase: 58 U/L (ref 39–117)
BUN: 18 mg/dL (ref 6–23)
CHLORIDE: 103 meq/L (ref 96–112)
CO2: 27 mEq/L (ref 19–32)
CREATININE: 0.8 mg/dL (ref 0.40–1.20)
Calcium: 9.7 mg/dL (ref 8.4–10.5)
GFR: 79.57 mL/min (ref >60.00)
Glucose, Bld: 95 mg/dL (ref 70–99)
Potassium: 4.8 mEq/L (ref 3.5–5.1)
SODIUM: 138 meq/L (ref 135–145)
TOTAL PROTEIN: 7.2 g/dL (ref 6.0–8.3)
Total Bilirubin: 0.5 mg/dL (ref 0.2–1.2)

## 2018-08-04 LAB — LIPID PANEL
Cholesterol: 208 mg/dL — ABNORMAL HIGH (ref 0–200)
HDL: 78.3 mg/dL (ref >39.00)
LDL CALC: 112 mg/dL — AB (ref 0–99)
NONHDL: 129.73
Total CHOL/HDL Ratio: 3
Triglycerides: 90 mg/dL (ref 0.0–149.0)
VLDL: 18 mg/dL (ref 0.0–40.0)

## 2018-08-04 LAB — CBC
HCT: 38.8 % (ref 36.0–46.0)
Hemoglobin: 13.2 g/dL (ref 12.0–15.0)
MCHC: 33.9 g/dL (ref 30.0–36.0)
MCV: 93.9 fl (ref 78.0–100.0)
Platelets: 425 10*3/uL — ABNORMAL HIGH (ref 150.0–400.0)
RBC: 4.14 Mil/uL (ref 3.87–5.11)
RDW: 13.2 % (ref 11.5–15.5)
WBC: 8.9 10*3/uL (ref 4.0–10.5)

## 2018-08-04 LAB — HEMOGLOBIN A1C: HEMOGLOBIN A1C: 5.5 % (ref 4.6–6.5)

## 2018-08-06 ENCOUNTER — Other Ambulatory Visit: Payer: Self-pay | Admitting: Family Medicine

## 2018-08-06 DIAGNOSIS — J302 Other seasonal allergic rhinitis: Secondary | ICD-10-CM

## 2018-08-06 DIAGNOSIS — J4530 Mild persistent asthma, uncomplicated: Secondary | ICD-10-CM

## 2018-08-08 NOTE — Progress Notes (Deleted)
[place] at [facility] [address], [place], [place] [address] [phone]  Date:  08/09/2018   Name:  Jane Spencer   DOB:  [birth year]   MRN:  [id number]  PCP:  Darreld Mclean, MD    Chief Complaint: No chief complaint on file.   History of Present Illness:  Jane Spencer is a 52 y.o. very pleasant female patient who presents with the following:  Here today for a CPE History of mild asthma Light smoker  Pap: 10/18- negative Mammo: 2017 Labs: just done at visit  Immun: flu, shingrix? Colon: recently ordered cologuard kit  I actually just saw her last week and did full labs for her: Here today for medication refills -she has been doing well generally over the last year She will notice some burning in her right sided nasal passage just generally if she leans over She is using both zyrtec and singulair,and also flonase, astelin She also needs to refill her symbicort and albuterol She uses symbicort every day, albuterol prn Her last mammo was about a year ago per her report- encouraged her to catch up on this test  She has not been screened for colon cancer as of yet, no family history, wishes to use cologurad  It has been a very busy year. Her mother passed away at 1 yo, and she bought a new house  Will do routine labs for her today   Patient Active Problem List   Diagnosis Date Noted  . Nasal polyp - anterior 01/02/2016  . Tobacco use disorder 12/22/2015  . Asthma, mild persistent 02/26/2015    Past Medical History:  Diagnosis Date  . Allergy   . Asthma   . Environmental allergies   . Plantar fasciitis    Bilateral  . Seasonal allergies     Past Surgical History:  Procedure Laterality Date  . CESAREAN SECTION    . FOOT SURGERY     Right  . TONSILLECTOMY      Social History   Tobacco Use  . Smoking status: Current Some Day Smoker    Packs/day: 0.10    Types: Cigarettes  . Smokeless tobacco: Never  Used  . Tobacco comment: 1 cigarette per day  Substance Use Topics  . Alcohol use: Yes    Alcohol/week: 8.0 standard drinks    Types: 8 Standard drinks or equivalent per week  . Drug use: No    Family History  Problem Relation Age of Onset  . Stroke Father 33       Deceased  . Other Father        Borderline Diabetes  . Macular degeneration Mother        Living  . Vascular Disease Mother   . Lung cancer Maternal Uncle        #1  . Heart attack Maternal Uncle        Deceased at 63, #2  . Skin cancer Maternal Uncle        #1  . HIV/AIDS Brother        #1  . Healthy Brother        #2  . Healthy Sister        x2  . Heart defect Son        Bicuspid Valve  . Healthy Son        #2  . Healthy Daughter        x1  . Allergies Son        #  1    No Known Allergies  Medication list has been reviewed and updated.  Current Outpatient Medications on File Prior to Visit  Medication Sig Dispense Refill  . albuterol (VENTOLIN HFA) 108 (90 Base) MCG/ACT inhaler INHALE 2 PUFFS BY MOUTH EVERY 6 HOURS AS NEEDED FOR COUGH OR WHEEZING 18 g 11  . azelastine (ASTELIN) 0.1 % nasal spray Place 1 spray into both nostrils 2 (two) times daily. Use in each nostril as directed 30 mL 11  . budesonide-formoterol (SYMBICORT) 80-4.5 MCG/ACT inhaler Inhale 2 puffs into the lungs 2 (two) times daily. 3 Inhaler 3  . cetirizine (ZYRTEC) 10 MG tablet TAKE 1 TABLET BY MOUTH ONCE DAILY IF NEEDED 90 tablet 3  . fluticasone (FLONASE) 50 MCG/ACT nasal spray INSTILL 1 TO 2 SPRAYS INTO EACH NOSTRIL EVERY DAY 16 g 11  . montelukast (SINGULAIR) 10 MG tablet Take 1 tablet (10 mg total) by mouth at bedtime. 90 tablet 3  . montelukast (SINGULAIR) 10 MG tablet TAKE 1 TABLET BY MOUTH AT BEDTIME 90 tablet 1   No current facility-administered medications on file prior to visit.     Review of Systems:  As per HPI- otherwise negative.   Physical Examination: There were no vitals filed for this visit. There were no  vitals filed for this visit. There is no height or weight on file to calculate BMI. Ideal Body Weight:    GEN: WDWN, NAD, Non-toxic, A & O x 3 HEENT: Atraumatic, Normocephalic. Neck supple. No masses, No LAD. Ears and Nose: No external deformity. CV: RRR, No M/G/R. No JVD. No thrill. No extra heart sounds. PULM: CTA B, no wheezes, crackles, rhonchi. No retractions. No resp. distress. No accessory muscle use. ABD: S, NT, ND, +BS. No rebound. No HSM. EXTR: No c/c/e NEURO Normal gait.  PSYCH: Normally interactive. Conversant. Not depressed or anxious appearing.  Calm demeanor.    Assessment and Plan: ***  Signed Lamar Blinks, MD

## 2018-08-09 ENCOUNTER — Encounter: Payer: BLUE CROSS/BLUE SHIELD | Admitting: Family Medicine

## 2018-10-16 ENCOUNTER — Telehealth: Payer: Self-pay | Admitting: Family Medicine

## 2018-10-16 MED ORDER — ALBUTEROL SULFATE 2.5 MG/0.5ML IN NEBU: 0.5000 mL | INHALATION_SOLUTION | Freq: Four times a day (QID) | RESPIRATORY_TRACT | 1 refills | Status: DC | PRN

## 2018-10-16 NOTE — Telephone Encounter (Signed)
Pt only has albuterol inhaler on her current medication list. Pt states she rarely uses the nebulizer solution but it should always be on her med list. LOV on 08/03/18.

## 2018-10-16 NOTE — Telephone Encounter (Signed)
Please advise 

## 2018-10-16 NOTE — Telephone Encounter (Signed)
Copied from CRM [id number]. Topic: Quick Communication - Rx Refill/Question >> Oct 16, 2018 12:10 PM Floria Raveling A wrote: Medication: albuterol (PROVENTIL) (2.5 MG/3ML) 0.083% nebulizer solution [[id number]]  DISCONTINUED - pt stated that she rarely uses this but she stated this should always be on her meds list .  She uses it normally once a year   Has the patient contacted their pharmacy? No  (Agent: If no, request that the patient contact the pharmacy for the refill.) (Agent: If yes, when and what did the pharmacy advise?)  Preferred Pharmacy (with phone number or street name): [facility] [phone] - [place], [place] - [phone] [phone] (Phone)   Agent: Please be advised that RX refills may take up to 3 business days. We ask that you follow-up with your pharmacy.

## 2019-06-06 ENCOUNTER — Other Ambulatory Visit: Payer: Self-pay | Admitting: Family Medicine

## 2019-06-06 DIAGNOSIS — J4530 Mild persistent asthma, uncomplicated: Secondary | ICD-10-CM

## 2019-08-03 ENCOUNTER — Other Ambulatory Visit: Payer: Self-pay | Admitting: Family Medicine

## 2019-08-03 DIAGNOSIS — J302 Other seasonal allergic rhinitis: Secondary | ICD-10-CM

## 2019-08-03 DIAGNOSIS — J4530 Mild persistent asthma, uncomplicated: Secondary | ICD-10-CM

## 2019-08-14 ENCOUNTER — Other Ambulatory Visit: Payer: Self-pay | Admitting: Family Medicine

## 2019-08-14 DIAGNOSIS — J302 Other seasonal allergic rhinitis: Secondary | ICD-10-CM

## 2019-08-14 NOTE — Telephone Encounter (Signed)
Copied from [place] [id number]. Topic: Quick Communication - Rx Refill/Question >> Aug 14, 2019  1:30 PM Rainey Pines A wrote: Medication: cetirizine (ZYRTEC) 10 MG tablet   Has the patient contacted their pharmacy? {Yes (Agent: If no, request that the patient contact the pharmacy for the refill.) (Agent: If yes, when and what did the pharmacy advise?)Contact PCP  Preferred Pharmacy (with phone number or street name): [id number] [place], [place] [phone] (Phone) [phone] (Fax)    Agent: Please be advised that RX refills may take up to 3 business days. We ask that you follow-up with your pharmacy.

## 2019-08-14 NOTE — Telephone Encounter (Signed)
Requested medication (s) are due for refill today: yes  Requested medication (s) are on the active medication list: yes  Last refill:  08/03/2018  Future visit scheduled: no  Notes to clinic:  Review for refill Overdue for follow up   Requested Prescriptions  Pending Prescriptions Disp Refills   cetirizine (ZYRTEC) 10 MG tablet 90 tablet 3    Sig: TAKE 1 TABLET BY MOUTH ONCE DAILY IF NEEDED     Ear, Nose, and Throat:  Antihistamines Failed - 08/14/2019  1:42 PM      Failed - Valid encounter within last 12 months    Recent Outpatient Visits          1 year ago Seasonal allergic rhinitis, unspecified trigger   [facility], Gay Filler, MD   1 year ago Acute non-recurrent maxillary sinusitis   [facility] at [place], [place]   1 year ago Fluid level behind tympanic membrane of right [facility], [place], [place]   2 years ago Screening for cervical cancer   [facility], Gay Filler, MD   2 years ago Other fatigue   [facility] [place], [place], [place]

## 2019-08-15 MED ORDER — CETIRIZINE HCL 10 MG PO TABS: ORAL_TABLET | ORAL | 0 refills | Status: DC

## 2019-08-15 NOTE — Addendum Note (Signed)
Addended by: Wynonia Musty A on: 08/15/2019 10:53 AM   Modules accepted: Orders

## 2019-08-15 NOTE — Telephone Encounter (Signed)
Medication sent to pharmacy  

## 2019-08-15 NOTE — Telephone Encounter (Signed)
Patient made an appt for this Wednesday 12/2.  Patient is asking for some medication to hold her over before appt.  Call back [phone]

## 2019-08-20 NOTE — Progress Notes (Signed)
[facility] [place] [address], [address] [place], [place] [address] [phone] Fax [phone]  Date:  08/22/2019   Name:  Jane Spencer   DOB:  03/02/1966   MRN:  [id number]  PCP:  Copland, Jessica C, MD    Chief Complaint: No chief complaint on file.   History of Present Illness:  Jane Spencer is a 53 y.o. 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  We will check a varicella titer  Albuterol as needed Astelin Symbicort Zyrtec Flonase Singulair  She is overall doing well, mood is good.  Her family has been well  Patient Active Problem List   Diagnosis Date Noted  . Nasal polyp - anterior 01/02/2016  . Tobacco use disorder 12/22/2015  . Asthma, mild persistent 02/26/2015    Past Medical History:  Diagnosis Date  . Allergy   . Asthma   . Environmental allergies   . Plantar fasciitis    Bilateral  . Seasonal allergies     Past Surgical History:  Procedure Laterality Date  . CESAREAN SECTION    . FOOT SURGERY     Right  . TONSILLECTOMY      Social History   Tobacco Use  . Smoking status: Current Some Day Smoker     Packs/day: 0.10    Types: Cigarettes  . Smokeless tobacco: Never Used  . Tobacco comment: 1 cigarette per day  Substance Use Topics  . Alcohol use: Yes    Alcohol/week: 8.0 standard drinks    Types: 8 Standard drinks or equivalent per week  . Drug use: No    Family History  Problem Relation Age of Onset  . Stroke Father 89       Deceased  . Other Father        Borderline Diabetes  . Macular degeneration Mother        Living  . Vascular Disease Mother   . Lung cancer Maternal Uncle        #1  . Heart attack Maternal Uncle        Deceased at 42, #2  . Skin cancer Maternal Uncle        #1  . HIV/AIDS Brother        #1  . Healthy Brother        #2  . Healthy Sister        x2  . Heart defect Son        Bicuspid Valve  . Healthy Son        #2  . Healthy Daughter          x1  . Allergies Son        #1    No Known Allergies  Medication list has been reviewed and updated.  Current Outpatient Medications on File Prior to Visit  Medication Sig Dispense Refill  . albuterol (VENTOLIN HFA) 108 (90 Base) MCG/ACT inhaler INHALE 2 PUFFS BY MOUTH EVERY 6 HOURS AS NEEDED FOR COUGH OR WHEEZING 18 g 5  . Albuterol Sulfate 2.5 MG/0.5ML NEBU Inhale 0.5 mLs into the lungs 4 (four) times daily as needed. 20 mL 1  . azelastine (ASTELIN) 0.1 % nasal spray Place 1 spray into both nostrils 2 (two) times daily. Use in each nostril as directed 30 mL 11  . budesonide-formoterol (SYMBICORT) 80-4.5 MCG/ACT inhaler Inhale 2 puffs into the lungs 2 (two) times daily. 3 Inhaler 3  . cetirizine (ZYRTEC) 10 MG tablet TAKE 1 TABLET BY MOUTH ONCE DAILY IF NEEDED 30 tablet 0  . fluticasone (FLONASE) 50 MCG/ACT nasal spray INSTILL 1 TO 2 SPRAYS INTO EACH NOSTRIL EVERY DAY 16 g 11  . montelukast (SINGULAIR) 10 MG tablet TAKE 1 TABLET BY MOUTH AT BEDTIME 90 tablet 1  . montelukast (SINGULAIR) 10 MG tablet TAKE 1 TABLET(10 MG) BY MOUTH AT BEDTIME 30 tablet 0   No current facility-administered medications  on file prior to visit.     Review of Systems:  As per HPI- otherwise negative.  No fever or chills, no chest pain or shortness of breath Physical Examination: There were no vitals filed for this visit. There were no vitals filed for this visit. There is no height or weight on file to calculate BMI. 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  However we were unable to get sent to work, so converted to telephone visit.  No cough, wheezing, shortness of breath is appreciated  Assessment and Plan: Mild persistent asthma without complication - Plan: budesonide-formoterol (SYMBICORT) 80-4.5 MCG/ACT inhaler, montelukast (SINGULAIR) 10 MG tablet  Seasonal allergic rhinitis, unspecified trigger - Plan: montelukast (SINGULAIR) 10 MG tablet, cetirizine (ZYRTEC) 10 MG tablet  Screening for hyperlipidemia - Plan: Lipid panel  Screening for deficiency anemia - Plan: CBC  Screening for diabetes mellitus - Plan: Comprehensive metabolic panel, Hemoglobin A1c  Screening for thyroid disorder - Plan: TSH  Screening for HIV (human immunodeficiency virus) - Plan: HIV antibody  Environmental and seasonal allergies - Plan: azelastine (ASTELIN) 0.1 % nasal spray, fluticasone (FLONASE) 50 MCG/ACT nasal spray  Seasonal allergies - Plan: budesonide-formoterol (SYMBICORT) 80-4.5 MCG/ACT inhaler  Encounter for screening mammogram for malignant neoplasm of breast - Plan: MM 3D SCREEN BREAST BILATERAL  Exposure to varicella - Plan: Varicella zoster antibody, IgG  Routine follow-up visit today Refilled medications for asthma and allergies Encouraged her to complete Cologuard kit Routine labs pending as above, she will come in for a lab only visit at her convenience, also ordered mammogram Encouraged her to get a flu shot Varicella titer pending   Spoke with patient for  about 15 minutes today   Signed Lamar Blinks, MD

## 2019-08-21 NOTE — Patient Instructions (Signed)
It was very nice to see you again today, I will be in touch with your labs ASAP

## 2019-08-22 ENCOUNTER — Encounter: Payer: Self-pay | Admitting: Family Medicine

## 2019-08-22 ENCOUNTER — Ambulatory Visit (INDEPENDENT_AMBULATORY_CARE_PROVIDER_SITE_OTHER): Payer: BLUE CROSS/BLUE SHIELD | Admitting: Family Medicine

## 2019-08-22 ENCOUNTER — Other Ambulatory Visit: Payer: Self-pay

## 2019-08-22 DIAGNOSIS — Z13220 Encounter for screening for lipoid disorders: Secondary | ICD-10-CM

## 2019-08-22 DIAGNOSIS — Z20820 Contact with and (suspected) exposure to varicella: Secondary | ICD-10-CM

## 2019-08-22 DIAGNOSIS — Z131 Encounter for screening for diabetes mellitus: Secondary | ICD-10-CM

## 2019-08-22 DIAGNOSIS — Z130 Encounter for screening for diseases of the blood and blood-forming organs and certain disorders involving the immune mechanism: Secondary | ICD-10-CM | POA: Diagnosis not present

## 2019-08-22 DIAGNOSIS — J302 Other seasonal allergic rhinitis: Secondary | ICD-10-CM

## 2019-08-22 DIAGNOSIS — Z1231 Encounter for screening mammogram for malignant neoplasm of breast: Secondary | ICD-10-CM

## 2019-08-22 DIAGNOSIS — Z1329 Encounter for screening for other suspected endocrine disorder: Secondary | ICD-10-CM

## 2019-08-22 DIAGNOSIS — Z114 Encounter for screening for human immunodeficiency virus [HIV]: Secondary | ICD-10-CM

## 2019-08-22 DIAGNOSIS — J4530 Mild persistent asthma, uncomplicated: Secondary | ICD-10-CM | POA: Diagnosis not present

## 2019-08-22 DIAGNOSIS — J3089 Other allergic rhinitis: Secondary | ICD-10-CM

## 2019-08-22 MED ORDER — MONTELUKAST SODIUM 10 MG PO TABS: 10.0000 mg | ORAL_TABLET | Freq: Every day | ORAL | 3 refills | Status: DC

## 2019-08-22 MED ORDER — CETIRIZINE HCL 10 MG PO TABS: ORAL_TABLET | ORAL | 3 refills | Status: DC

## 2019-08-22 MED ORDER — AZELASTINE HCL 0.1 % NA SOLN: 1.0000 | Freq: Two times a day (BID) | NASAL | 3 refills | Status: DC

## 2019-08-22 MED ORDER — BUDESONIDE-FORMOTEROL FUMARATE 80-4.5 MCG/ACT IN AERO: 2.0000 | INHALATION_SPRAY | Freq: Two times a day (BID) | RESPIRATORY_TRACT | 3 refills | Status: DC

## 2019-08-22 MED ORDER — FLUTICASONE PROPIONATE 50 MCG/ACT NA SUSP: NASAL | 11 refills | Status: DC

## 2019-10-24 ENCOUNTER — Telehealth: Payer: Self-pay

## 2019-10-24 NOTE — Telephone Encounter (Signed)
Patient called in to see if Dr. Patsy Lager can  send in a prescription  for her Albuterol Sulfate 2.5 MG/0.5ML NEBU [[id number]]    Sent to [id number] [place], [place] - [phone] AT [facility] [address] [facility] [address]  [address] [place] [address]  Phone:  [phone] Fax:  [phone]  DEA #:  [id number]   Please follow up with the patient at [phone]   Thanks,

## 2019-10-25 MED ORDER — ALBUTEROL SULFATE 2.5 MG/0.5ML IN NEBU: 0.5000 mL | INHALATION_SOLUTION | Freq: Four times a day (QID) | RESPIRATORY_TRACT | 1 refills | Status: DC | PRN

## 2019-10-25 NOTE — Telephone Encounter (Signed)
Medication refilled

## 2019-11-01 ENCOUNTER — Other Ambulatory Visit: Payer: Self-pay

## 2019-11-28 ENCOUNTER — Telehealth: Payer: Self-pay | Admitting: Family Medicine

## 2019-11-28 MED ORDER — ALBUTEROL SULFATE 2.5 MG/0.5ML IN NEBU: 0.5000 mL | INHALATION_SOLUTION | Freq: Four times a day (QID) | RESPIRATORY_TRACT | 1 refills | Status: AC | PRN

## 2019-11-28 NOTE — Telephone Encounter (Signed)
Medication:Albuterol Sulfate 2.5 MG/0.5ML NEBU   Has the patient contacted their pharmacy? Yes.   (If no, request that the patient contact the pharmacy for the refill.) (If yes, when and what did the pharmacy advise?)  Stated they were waiting on approval   Preferred Pharmacy (with phone number or street name):  [id number] [place], [place] - [phone] AT [facility] [address] [facility] [address]  [address] [place] [address]  Phone:  [phone] Fax:  [phone] Agent: Please be advised that RX refills may take up to 3 business days. We ask that you follow-up with your pharmacy.

## 2019-11-28 NOTE — Telephone Encounter (Signed)
Medication refilled

## 2019-12-03 ENCOUNTER — Telehealth: Payer: Self-pay | Admitting: Family Medicine

## 2019-12-03 DIAGNOSIS — J4530 Mild persistent asthma, uncomplicated: Secondary | ICD-10-CM

## 2019-12-03 NOTE — Telephone Encounter (Signed)
Medication: albuterol (VENTOLIN HFA) 108 (90 Base) MCG/ACT inhaler    Has the patient contacted their pharmacy? Yes.   (If no, request that the patient contact the pharmacy for the refill.) (If yes, when and what did the pharmacy advise?)  Preferred Pharmacy (with phone number or street name): [id number] [place], [place] - [phone] AT [facility] [address] [facility] [address]  [address] [place] [address]  Phone:  [phone] Fax:  [phone]  DEA #:  [id number]  Agent: Please be advised that RX refills may take up to 3 business days. We ask that you follow-up with your pharmacy.

## 2019-12-04 MED ORDER — ALBUTEROL SULFATE HFA 108 (90 BASE) MCG/ACT IN AERS: INHALATION_SPRAY | RESPIRATORY_TRACT | 5 refills | Status: DC

## 2019-12-04 NOTE — Telephone Encounter (Signed)
Medication refilled to pharmacy.  

## 2020-04-11 ENCOUNTER — Ambulatory Visit: Payer: BLUE CROSS/BLUE SHIELD | Admitting: Medical

## 2020-04-19 NOTE — Progress Notes (Addendum)
[place] [facility] [place], [place], [place] [address] [phone]  Date:  04/23/2020   Name:  Jane Spencer   DOB:  Mar 13, 1966   MRN:  [id number]  PCP:  Darreld Mclean, MD    Chief Complaint: Annual Exam   History of Present Illness:  Jane Spencer is a 54 y.o. very pleasant female patient who presents with the following:  Pt with history of mild asthma, here today with concern of feeling tired and achy She notes that about 2 weeks ago she was feeling achy for about 2 days- however she is now feeling ok.  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  Step, toning, kickboxing, pilates.  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  Will order titer today    Patient Active Problem List   Diagnosis Date Noted  . Nasal polyp - anterior 01/02/2016  . Tobacco use disorder 12/22/2015  . Asthma, mild persistent 02/26/2015    Past Medical History:  Diagnosis Date  . Allergy   . Asthma   . Environmental allergies   . Plantar fasciitis    Bilateral  . Seasonal allergies     Past Surgical History:   Procedure Laterality Date  . CESAREAN SECTION    . FOOT SURGERY     Right  . TONSILLECTOMY      Social History   Tobacco Use  . Smoking status: Current Some Day Smoker    Packs/day: 0.10    Types: Cigarettes  . Smokeless tobacco: Never Used  . Tobacco comment: 1 cigarette per day  Substance Use Topics  . Alcohol use: Yes    Alcohol/week: 8.0 standard drinks    Types: 8 Standard drinks or equivalent per week  . Drug use: No    Family History  Problem Relation Age of Onset  . Stroke Father 77       Deceased  . Other Father        Borderline Diabetes  . Macular degeneration Mother        Living  . Vascular Disease Mother   . Lung cancer Maternal Uncle        #1  . Heart attack Maternal Uncle        Deceased at 57, #2  . Skin cancer Maternal Uncle        #1  . HIV/AIDS Brother        #1  . Healthy Brother        #2  .  Healthy Sister        x2  . Heart defect Son        Bicuspid Valve  . Healthy Son        #2  . Healthy Daughter        x1  . Allergies Son        #1    No Known Allergies  Medication list has been reviewed and updated.  Current Outpatient Medications on File Prior to Visit  Medication Sig Dispense Refill  . albuterol (VENTOLIN HFA) 108 (90 Base) MCG/ACT inhaler INHALE 2 PUFFS BY MOUTH EVERY 6 HOURS AS NEEDED FOR COUGH OR WHEEZING 18 g 5  . Albuterol Sulfate 2.5 MG/0.5ML NEBU Inhale 0.5 mLs into the lungs 4 (four) times daily as needed. 20 mL 1  . azelastine (ASTELIN) 0.1 % nasal spray Place 1 spray into both nostrils 2 (two) times daily. Use in each nostril as directed 90 mL 3  . budesonide-formoterol (SYMBICORT) 80-4.5 MCG/ACT inhaler Inhale 2 puffs into the lungs 2 (two) times daily. 3 Inhaler 3  . cetirizine (ZYRTEC) 10 MG tablet TAKE 1 TABLET BY MOUTH ONCE DAILY IF NEEDED 90 tablet 3  . fluticasone (FLONASE) 50 MCG/ACT nasal spray INSTILL 1 TO 2 SPRAYS INTO EACH NOSTRIL EVERY DAY 48 mL 11  . montelukast (SINGULAIR) 10 MG tablet TAKE 1  TABLET(10 MG) BY MOUTH AT BEDTIME 30 tablet 0  . montelukast (SINGULAIR) 10 MG tablet Take 1 tablet (10 mg total) by mouth at bedtime. 90 tablet 3   No current facility-administered medications on file prior to visit.    Review of Systems:  As per HPI- otherwise negative.   Physical Examination: Vitals:   04/23/20 1438  BP: 122/68  Pulse: 62  Resp: 16  SpO2: 96%   Vitals:   04/23/20 1438  Weight: 136 lb (61.7 kg)  Height: _0  (1.549 m)   Body mass index is 25.7 kg/m. Ideal Body Weight: Weight in (lb) to have BMI = 25: 132  GEN: no acute distress.  Mild overweight, looks well  HEENT: Atraumatic, Normocephalic. Bilateral TM wnl, oropharynx normal.  PEERL,EOMI.   Ears and Nose: No external deformity. CV: RRR, No M/G/R. No JVD. No thrill. No extra heart sounds. PULM: CTA B, no wheezes, crackles, rhonchi. No retractions. No resp. distress. No accessory muscle use. ABD: S, NT, ND, +BS. No rebound. No HSM. EXTR: No c/c/e PSYCH: Normally interactive. Conversant.  Right shoulder -patient notes she fell on outstretched arm a couple of years ago, for a while had very limited range of motion of her shoulder.  Thankfully she has self rehab her shoulder to basically normal range of motion.  She does not notice some tingling in the right trapezius muscle.  I offered to have her seen by orthopedics but she declines for now    Assessment and Plan: Mild persistent asthma without complication  Fatigue, unspecified type - Plan: CBC, Sedimentation rate, VITAMIN D 25 Hydroxy (Vit-D Deficiency, Fractures), Ferritin  Screening for hyperlipidemia - Plan: Lipid panel  Screening for diabetes mellitus - Plan: Comprehensive metabolic panel, Hemoglobin A1c  Screening for thyroid disorder - Plan: TSH  Encounter for hepatitis C screening test for low risk patient - Plan: Hepatitis C antibody  Screening for colon cancer  Encounter for screening mammogram for malignant neoplasm of breast -  Plan: MM 3D SCREEN BREAST BILATERAL  Exposure to varicella - Plan: Varicella zoster antibody, IgG  Patient here today for a follow-up visit Ordered  Cologuard Ordered mammogram Routine labs pending as above She has noticed some fatigue and more body aches.  This may be due to aging with her very active lifestyle, but she would like to make sure there is no secondary cause.  Will obtain labs for her as above Varicella-zoster titer pending to determine if she may need shingles vaccine  Will plan further follow- up pending labs.  This visit occurred during the SARS-CoV-2 public health emergency.  Safety protocols were in place, including screening questions prior to the visit, additional usage of staff PPE, and extensive cleaning of exam room while observing appropriate contact time as indicated for disinfecting solutions.    Signed Lamar Blinks, MD  Received her labs as below, letter to patient  Results for orders placed or performed in visit on 04/23/20  CBC  Result Value Ref Range   WBC 8.3 4.0 - 10.5 K/uL   RBC 4.01 3.87 - 5.11 Mil/uL   Platelets 387.0 150 - 400 K/uL   Hemoglobin 13.0 12.0 - 15.0 g/dL   HCT 38.1 36 - 46 %   MCV 95.1 78.0 - 100.0 fl   MCHC 34.2 30.0 - 36.0 g/dL   RDW 12.9 11.5 - 15.5 %  Comprehensive metabolic panel  Result Value Ref Range   Sodium 137 135 - 145 mEq/L   Potassium 4.2 3.5 - 5.1 mEq/L   Chloride 106 96 - 112 mEq/L   CO2 24 19 - 32 mEq/L   Glucose, Bld 98 70 - 99 mg/dL   BUN 22 6 - 23 mg/dL   Creatinine, Ser 0.75 0.40 - 1.20 mg/dL   Total Bilirubin 0.4 0.2 - 1.2 mg/dL   Alkaline Phosphatase 61 39 - 117 U/L   AST 21 0 - 37 U/L   ALT 19 0 - 35 U/L   Total Protein 6.9 6.0 - 8.3 g/dL   Albumin 4.5 3.5 - 5.2 g/dL   GFR 80.14 >60.00 mL/min   Calcium 9.5 8.4 - 10.5 mg/dL  Hemoglobin A1c  Result Value Ref Range   Hgb A1c MFr Bld 5.4 4.6 - 6.5 %  Lipid panel  Result Value Ref Range   Cholesterol 211 (H) 0 - 200 mg/dL   Triglycerides 149.0  0 - 149 mg/dL   HDL 78.50 >39.00 mg/dL   VLDL 29.8 0.0 - 40.0 mg/dL   LDL Cholesterol 103 (H) 0 - 99 mg/dL   Total CHOL/HDL Ratio 3    NonHDL 132.66   TSH  Result Value Ref Range   TSH 1.09 0.35 - 4.50 uIU/mL  Sedimentation rate  Result Value Ref Range   Sed Rate 7 0 - 30 mm/hr  VITAMIN D 25 Hydroxy (Vit-D Deficiency, Fractures)  Result Value Ref Range   VITD 34.25 30.00 - 100.00 ng/mL  Ferritin  Result Value Ref Range   Ferritin 46.6 10.0 - 291.0 ng/mL  Varicella zoster antibody, IgG  Result Value Ref Range   Varicella IgG 204.90 index

## 2020-04-23 ENCOUNTER — Other Ambulatory Visit: Payer: Self-pay

## 2020-04-23 ENCOUNTER — Ambulatory Visit (INDEPENDENT_AMBULATORY_CARE_PROVIDER_SITE_OTHER): Payer: BLUE CROSS/BLUE SHIELD | Admitting: Family Medicine

## 2020-04-23 ENCOUNTER — Encounter: Payer: Self-pay | Admitting: Family Medicine

## 2020-04-23 VITALS — BP 122/68 | HR 62 | Resp 16 | Ht 61.0 in | Wt 136.0 lb

## 2020-04-23 DIAGNOSIS — J4530 Mild persistent asthma, uncomplicated: Secondary | ICD-10-CM | POA: Diagnosis not present

## 2020-04-23 DIAGNOSIS — Z131 Encounter for screening for diabetes mellitus: Secondary | ICD-10-CM

## 2020-04-23 DIAGNOSIS — R5383 Other fatigue: Secondary | ICD-10-CM

## 2020-04-23 DIAGNOSIS — Z1231 Encounter for screening mammogram for malignant neoplasm of breast: Secondary | ICD-10-CM

## 2020-04-23 DIAGNOSIS — Z1329 Encounter for screening for other suspected endocrine disorder: Secondary | ICD-10-CM | POA: Diagnosis not present

## 2020-04-23 DIAGNOSIS — Z1159 Encounter for screening for other viral diseases: Secondary | ICD-10-CM

## 2020-04-23 DIAGNOSIS — Z20820 Contact with and (suspected) exposure to varicella: Secondary | ICD-10-CM

## 2020-04-23 DIAGNOSIS — Z13220 Encounter for screening for lipoid disorders: Secondary | ICD-10-CM | POA: Diagnosis not present

## 2020-04-23 DIAGNOSIS — Z1211 Encounter for screening for malignant neoplasm of colon: Secondary | ICD-10-CM

## 2020-04-23 NOTE — Patient Instructions (Signed)
Good to see you again today- I will be in touch with your labs asap, and we will order a Cologuard kit for you For knee and joint pain I would recommend Voltaren gel topically, tylenol by mouth as needed  Please call to schedule a mammogram with [facility] at your convenience Address: [address] [place], [place] [address] Phone: [phone]  Let me know if any changes or worsening of your symptoms

## 2020-04-24 LAB — COMPREHENSIVE METABOLIC PANEL
ALT: 19 U/L (ref 0–35)
AST: 21 U/L (ref 0–37)
Albumin: 4.5 g/dL (ref 3.5–5.2)
Alkaline Phosphatase: 61 U/L (ref 39–117)
BUN: 22 mg/dL (ref 6–23)
CO2: 24 mEq/L (ref 19–32)
Calcium: 9.5 mg/dL (ref 8.4–10.5)
Chloride: 106 mEq/L (ref 96–112)
Creatinine, Ser: 0.75 mg/dL (ref 0.40–1.20)
GFR: 80.14 mL/min (ref >60.00)
Glucose, Bld: 98 mg/dL (ref 70–99)
Potassium: 4.2 mEq/L (ref 3.5–5.1)
Sodium: 137 mEq/L (ref 135–145)
Total Bilirubin: 0.4 mg/dL (ref 0.2–1.2)
Total Protein: 6.9 g/dL (ref 6.0–8.3)

## 2020-04-24 LAB — CBC
HCT: 38.1 % (ref 36.0–46.0)
Hemoglobin: 13 g/dL (ref 12.0–15.0)
MCHC: 34.2 g/dL (ref 30.0–36.0)
MCV: 95.1 fl (ref 78.0–100.0)
Platelets: 387 10*3/uL (ref 150.0–400.0)
RBC: 4.01 Mil/uL (ref 3.87–5.11)
RDW: 12.9 % (ref 11.5–15.5)
WBC: 8.3 10*3/uL (ref 4.0–10.5)

## 2020-04-24 LAB — VITAMIN D 25 HYDROXY (VIT D DEFICIENCY, FRACTURES): VITD: 34.25 ng/mL (ref 30.00–100.00)

## 2020-04-24 LAB — LIPID PANEL
Cholesterol: 211 mg/dL — ABNORMAL HIGH (ref 0–200)
HDL: 78.5 mg/dL (ref >39.00)
LDL Cholesterol: 103 mg/dL — ABNORMAL HIGH (ref 0–99)
NonHDL: 132.66
Total CHOL/HDL Ratio: 3
Triglycerides: 149 mg/dL (ref 0.0–149.0)
VLDL: 29.8 mg/dL (ref 0.0–40.0)

## 2020-04-24 LAB — HEPATITIS C ANTIBODY
Hepatitis C Ab: NONREACTIVE
SIGNAL TO CUT-OFF: 0 (ref <1.00)

## 2020-04-24 LAB — HEMOGLOBIN A1C: Hgb A1c MFr Bld: 5.4 % (ref 4.6–6.5)

## 2020-04-24 LAB — SEDIMENTATION RATE: Sed Rate: 7 mm/hr (ref 0–30)

## 2020-04-24 LAB — FERRITIN: Ferritin: 46.6 ng/mL (ref 10.0–291.0)

## 2020-04-24 LAB — TSH: TSH: 1.09 u[IU]/mL (ref 0.35–4.50)

## 2020-04-24 LAB — VARICELLA ZOSTER ANTIBODY, IGG: Varicella IgG: 204.9 index

## 2020-04-24 NOTE — Progress Notes (Signed)
Cologuard ordered for patient.  

## 2020-05-07 ENCOUNTER — Other Ambulatory Visit: Payer: Self-pay | Admitting: Family Medicine

## 2020-05-07 DIAGNOSIS — J4530 Mild persistent asthma, uncomplicated: Secondary | ICD-10-CM

## 2020-06-10 ENCOUNTER — Encounter (HOSPITAL_COMMUNITY): Payer: Self-pay

## 2020-06-10 ENCOUNTER — Emergency Department (HOSPITAL_COMMUNITY): Payer: No Typology Code available for payment source

## 2020-06-10 ENCOUNTER — Emergency Department (HOSPITAL_COMMUNITY)
Admission: EM | Admit: 2020-06-10 | Discharge: 2020-06-10 | Disposition: A | Discharge status: Home / Self Care | Payer: No Typology Code available for payment source | Attending: Emergency Medicine | Admitting: Emergency Medicine

## 2020-06-10 ENCOUNTER — Other Ambulatory Visit: Payer: Self-pay

## 2020-06-10 ENCOUNTER — Emergency Department (HOSPITAL_COMMUNITY)
Admission: EM | Admit: 2020-06-10 | Discharge: 2020-06-10 | Disposition: A | Discharge status: Home / Self Care | Payer: BLUE CROSS/BLUE SHIELD

## 2020-06-10 DIAGNOSIS — S8392XA Sprain of unspecified site of left knee, initial encounter: Secondary | ICD-10-CM | POA: Diagnosis not present

## 2020-06-10 DIAGNOSIS — J45909 Unspecified asthma, uncomplicated: Secondary | ICD-10-CM | POA: Insufficient documentation

## 2020-06-10 DIAGNOSIS — Y9389 Activity, other specified: Secondary | ICD-10-CM | POA: Insufficient documentation

## 2020-06-10 DIAGNOSIS — Z79899 Other long term (current) drug therapy: Secondary | ICD-10-CM | POA: Diagnosis not present

## 2020-06-10 DIAGNOSIS — F17210 Nicotine dependence, cigarettes, uncomplicated: Secondary | ICD-10-CM | POA: Diagnosis not present

## 2020-06-10 DIAGNOSIS — S80912A Unspecified superficial injury of left knee, initial encounter: Secondary | ICD-10-CM | POA: Diagnosis present

## 2020-06-10 DIAGNOSIS — Y990 Civilian activity done for income or pay: Secondary | ICD-10-CM | POA: Diagnosis not present

## 2020-06-10 DIAGNOSIS — Y9289 Other specified places as the place of occurrence of the external cause: Secondary | ICD-10-CM | POA: Diagnosis not present

## 2020-06-10 DIAGNOSIS — X509XXA Other and unspecified overexertion or strenuous movements or postures, initial encounter: Secondary | ICD-10-CM | POA: Diagnosis not present

## 2020-06-10 MED ORDER — HYDROCODONE-ACETAMINOPHEN 5-325 MG PO TABS
1.0000 | ORAL_TABLET | Freq: Once | ORAL | Status: DC
  Filled 2020-06-10: qty 1

## 2020-06-10 MED ORDER — NAPROXEN 500 MG PO TABS: 500.0000 mg | ORAL_TABLET | Freq: Two times a day (BID) | ORAL | 0 refills | Status: DC

## 2020-06-10 MED ORDER — IBUPROFEN 200 MG PO TABS
600.0000 mg | ORAL_TABLET | Freq: Once | ORAL | Status: AC
  Administered 2020-06-10: 600 mg via ORAL
  Filled 2020-06-10: qty 3

## 2020-06-10 NOTE — ED Triage Notes (Signed)
Pt presents with c/o left knee injury. 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 Pt has been able to take a few steps since the incident but with difficulty.

## 2020-06-10 NOTE — Discharge Instructions (Signed)
Wear the knee sleeve as directed.  Use the crutches as needed. Make sure you are performing range of motion to prevent stiffness. Use the naproxen as needed to help with pain and swelling. Follow-up with orthopedist listed below. Return to the ER if you start to experience worsening pain, redness or warmth of your joint, additional injuries or falls, numbness or weakness.

## 2020-06-10 NOTE — Progress Notes (Signed)
Orthopedic Tech Progress Note Patient Details:  Jane Spencer [birth year] [id number]  Ortho Devices Type of Ortho Device: Knee Immobilizer, Crutches Ortho Device/Splint Location: left Ortho Device/Splint Interventions: Application   Post Interventions Patient Tolerated: Well Instructions Provided: Care of device   Saul Fordyce 06/10/2020, 3:35 PM

## 2020-06-10 NOTE — ED Provider Notes (Signed)
[facility]-EMERGENCY DEPT Provider Note   CSN: [id number] Arrival date & time: 06/10/20  1343     History Chief Complaint  Patient presents with  . Knee Injury    Jane Spencer is a 54 y.o. female who presents to ED with a chief complaint of L knee pain. 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 She landed on the floor but was able to get up on her own. After applying ice to the area, she attempted to ambulate.She has been able to ambulate since the fall, although reports pain the more she ambulates. Pain will be throughout the L anterior knee and will intermittently radiate down the leg. She denies any other injuries or joint pain. No head injury or loss of consciousness. No prior injury, fracture, dislocation or procedure in the area. Has not taken any medications to help with her pain. Denies any numbness or weakness of extremities.  HPI     Past Medical History:  Diagnosis Date  . Allergy   . Asthma   . Environmental allergies   . Plantar fasciitis    Bilateral  . Seasonal allergies     Patient Active Problem List   Diagnosis Date Noted  . Nasal polyp - anterior 01/02/2016  . Tobacco use disorder 12/22/2015  . Asthma, mild persistent 02/26/2015    Past Surgical History:  Procedure Laterality Date  . CESAREAN SECTION    . FOOT SURGERY     Right  . TONSILLECTOMY       OB History   No obstetric history on file.     Family History  Problem Relation Age of Onset  . Macular degeneration Mother        Living  . Vascular Disease Mother   . Stroke Father 43       Deceased  . Other Father        Borderline Diabetes  . Lung cancer Maternal Uncle        #1  . Heart attack Maternal Uncle        Deceased at 57, #2  . Skin cancer Maternal Uncle        #1  . HIV/AIDS Brother        #1  . Healthy Brother        #2  . Healthy Sister        x2  . Heart defect Son         Bicuspid Valve  . Healthy Son        #2  . Healthy Daughter        x1  . Allergies Son        #1    Social History   Tobacco Use  . Smoking status: Current Some Day Smoker    Packs/day: 0.10    Types: Cigarettes  . Smokeless tobacco: Never Used  . Tobacco comment: 1 cigarette per day  Substance Use Topics  . Alcohol use: Yes    Alcohol/week: 8.0 standard drinks    Types: 8 Standard drinks or equivalent per week  . Drug use: No    Home Medications Prior to Admission medications   Medication Sig Start Date End Date Taking? Authorizing Provider  albuterol (VENTOLIN HFA) 108 (90 Base) MCG/ACT inhaler INHALE 2 PUFFS BY MOUTH EVERY 6 HOURS AS NEEDED FOR COUGH OR WHEEZING 05/07/20   Copland, Gwenlyn Found, MD  Albuterol Sulfate 2.5  MG/0.5ML NEBU Inhale 0.5 mLs into the lungs 4 (four) times daily as needed. 11/28/19   Copland, Gwenlyn Found, MD  azelastine (ASTELIN) 0.1 % nasal spray Place 1 spray into both nostrils 2 (two) times daily. Use in each nostril as directed 08/22/19   Copland, Gwenlyn Found, MD  budesonide-formoterol (SYMBICORT) 80-4.5 MCG/ACT inhaler Inhale 2 puffs into the lungs 2 (two) times daily. 08/22/19   Copland, Gwenlyn Found, MD  cetirizine (ZYRTEC) 10 MG tablet TAKE 1 TABLET BY MOUTH ONCE DAILY IF NEEDED 08/22/19   Copland, Gwenlyn Found, MD  fluticasone (FLONASE) 50 MCG/ACT nasal spray INSTILL 1 TO 2 SPRAYS INTO EACH NOSTRIL EVERY DAY 08/22/19   Copland, Gwenlyn Found, MD  montelukast (SINGULAIR) 10 MG tablet TAKE 1 TABLET(10 MG) BY MOUTH AT BEDTIME 08/03/19   Copland, Gwenlyn Found, MD  montelukast (SINGULAIR) 10 MG tablet Take 1 tablet (10 mg total) by mouth at bedtime. 08/22/19   Copland, Gwenlyn Found, MD  naproxen (NAPROSYN) 500 MG tablet Take 1 tablet (500 mg total) by mouth 2 (two) times daily. 06/10/20   Dietrich Pates, PA-C    Allergies    Patient has no known allergies.  Review of Systems   Review of Systems  Constitutional: Negative for chills and fever.  Musculoskeletal: Positive for  arthralgias and joint swelling.  Skin: Negative for wound.  Neurological: Negative for weakness and numbness.    Physical Exam Updated Vital Signs BP (!) 156/84 (BP Location: Right Arm)   Pulse 66   Temp 98.9 F (37.2 C) (Oral)   Resp 18   SpO2 98%   Physical Exam Vitals and nursing note reviewed.  Constitutional:      General: She is not in acute distress.    Appearance: She is well-developed. She is not diaphoretic.  HENT:     Head: Normocephalic and atraumatic.  Eyes:     General: No scleral icterus.    Conjunctiva/sclera: Conjunctivae normal.  Pulmonary:     Effort: Pulmonary effort is normal. No respiratory distress.  Musculoskeletal:        General: Swelling and tenderness present. No deformity.     Cervical back: Normal range of motion.     Comments: Minimal tenderness of the L anterior knee with a small amount of swelling noted. Normal sensation to light touch of bilateral lower extremities. 2+ DP pulse noted. Compartments are soft. Able to extend at knee but reports pain with flexion. No deformities seen.  No calf tenderness.  No erythema, warmth of joint or leg noted.  Skin:    Findings: No rash.  Neurological:     Mental Status: She is alert.     ED Results / Procedures / Treatments   Labs (all labs ordered are listed, but only abnormal results are displayed) Labs Reviewed - No data to display  EKG None  Radiology DG Knee Complete 4 Views Left  Result Date: 06/10/2020 CLINICAL DATA:  Injury EXAM: LEFT KNEE - COMPLETE 4+ VIEW COMPARISON:  None. FINDINGS: No evidence of fracture, dislocation, or joint effusion. No evidence of arthropathy or other focal bone abnormality. Soft tissues are unremarkable. Superior patellar enthesophyte. IMPRESSION: Negative. Electronically Signed   By: Feliberto Harts MD   On: 06/10/2020 14:57    Procedures Procedures (including critical care time)  Medications Ordered in ED Medications  HYDROcodone-acetaminophen  (NORCO/VICODIN) 5-325 MG per tablet 1 tablet (has no administration in time range)  ibuprofen (ADVIL) tablet 600 mg (600 mg Oral Given 06/10/20 1452)    ED  Course  I have reviewed the triage vital signs and the nursing notes.  Pertinent labs & imaging results that were available during my care of the patient were reviewed by me and considered in my medical decision making (see chart for details).    MDM Rules/Calculators/A&P                          54 year old female presenting to the ED with left knee pain.  Felt a pop when she slipped earlier today while teaching a fitness class.  No prior injury, fracture, dislocation procedure in the area.  Tenderness to palpation and pain is worse with movement especially flexion.  No deformities, changes to strength or sensation noted.  Equal intact distal pulses.  X-rays negative for acute abnormality.  Suspect that her symptoms are due to a sprain.  Doubt infectious or vascular cause of symptoms.  Will treat with rice, crutches as needed and follow-up with orthopedist.  Return precautions given.  All imaging, if done today, including plain films, CT scans, and ultrasounds, independently reviewed by me, and interpretations confirmed via formal radiology reads.  Patient is hemodynamically stable, in NAD, and able to ambulate in the ED. Evaluation does not show pathology that would require ongoing emergent intervention or inpatient treatment. I explained the diagnosis to the patient. Pain has been managed and has no complaints prior to discharge. Patient is comfortable with above plan and is stable for discharge at this time. All questions were answered prior to disposition. Strict return precautions for returning to the ED were discussed. Encouraged follow up with PCP.   An After Visit Summary was printed and given to the patient.   Portions of this note were generated with Scientist, clinical (histocompatibility and immunogenetics). Dictation errors may occur despite best attempts at  proofreading.  Final Clinical Impression(s) / ED Diagnoses Final diagnoses:  Sprain of left knee, unspecified ligament, initial encounter    Rx / DC Orders ED Discharge Orders         Ordered    naproxen (NAPROSYN) 500 MG tablet  2 times daily        06/10/20 1522           Dietrich Pates, PA-C 06/10/20 1524    Terald Sleeper, MD 06/10/20 [phone]

## 2020-06-11 ENCOUNTER — Telehealth: Payer: Self-pay | Admitting: Orthopaedic Surgery

## 2020-06-11 MED ORDER — TRAMADOL HCL 50 MG PO TABS: 50.0000 mg | ORAL_TABLET | Freq: Two times a day (BID) | ORAL | 0 refills | Status: DC | PRN

## 2020-06-11 NOTE — Telephone Encounter (Signed)
Patient's husband called.   Upcoming appointment for a ER follow up. Wanted to know if the pt could be given a stronger pain medication   Call back: [phone]

## 2020-06-11 NOTE — Addendum Note (Signed)
Addended by: Rogers Seeds on: 06/11/2020 05:09 PM   Modules accepted: Orders

## 2020-06-11 NOTE — Telephone Encounter (Signed)
Please advise. Patient was seen yesterday in ED with acute knee injury. Naproxen was prescribed. Appt scheduled for patient to see you on 06/18/2020

## 2020-06-11 NOTE — Telephone Encounter (Signed)
OK to send in ultram # 30  one po bid prn

## 2020-06-11 NOTE — Telephone Encounter (Signed)
Called to pharmacy. Patient's husband advised.

## 2020-06-18 ENCOUNTER — Encounter: Payer: Self-pay | Admitting: Orthopaedic Surgery

## 2020-06-18 ENCOUNTER — Ambulatory Visit: Payer: No Typology Code available for payment source | Admitting: Orthopaedic Surgery

## 2020-06-18 VITALS — BP 158/75 | HR 62

## 2020-06-18 DIAGNOSIS — M25562 Pain in left knee: Secondary | ICD-10-CM | POA: Diagnosis not present

## 2020-06-18 DIAGNOSIS — S83289A Other tear of lateral meniscus, current injury, unspecified knee, initial encounter: Secondary | ICD-10-CM | POA: Insufficient documentation

## 2020-06-18 DIAGNOSIS — S83282A Other tear of lateral meniscus, current injury, left knee, initial encounter: Secondary | ICD-10-CM | POA: Diagnosis not present

## 2020-06-18 NOTE — Progress Notes (Signed)
Office Visit Note   Patient: Jane Spencer           Date of Birth: 25-Jan-1966           MRN: [id number] Visit Date: 06/18/2020              Requested by: Pearline Cables, MD [address] [place],  [place] [address] PCP: Pearline Cables, MD   Assessment & Plan: Visit Diagnoses:  1. Left knee pain, unspecified chronicity   2. Acute lateral meniscus tear of left knee, initial encounter     Plan: Patient cannot stand and cannot weight-bear.  She has exquisite lateral joint line tenderness and inability to flex her knee or weight-bear and may have a lateral meniscal tear.  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  Office follow-up after MRI left knee rule out lateral meniscal tear.  Follow-Up Instructions: No follow-ups on file.   Orders:  Orders Placed This Encounter  Procedures  . MR Knee Left w/o contrast   No orders of the defined types were placed in this encounter.     Procedures: No procedures performed   Clinical Data: No additional findings.   Subjective: Chief Complaint  Patient presents with  . 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  She was able to ambulate out of the room then had increased pain.  She is currently on crutches not able ambulate she has a knee brace she has been wearing.  Plain radiographs of been negative she is using tramadol for pain as well as ice and Naprosyn 500 mg p.o. twice daily with meals.  No past history of injury to her knee.  Patient is very anxious and is concerned about how long she will be out of work since she is not able to teach aerobics  on crutches and not being able to stand without her crutches.  She denies associated back pain no groin pain no numbness or tingling in her feet.  She has had some mild swelling in her left knee has pain primarily anterior joint line medial and lateral and around the patella.  Review of Systems positive for mild asthma smoking history.  Otherwise patient's been active healthy with otherwise negative review of systems.   Objective: Vital Signs: BP (!) 158/75   Pulse 62   Physical Exam Constitutional:      Appearance: She is well-developed.  HENT:     Head: Normocephalic.     Right Ear: External ear normal.     Left Ear: External ear normal.  Eyes:     Pupils: Pupils are equal, round, and reactive to light.  Neck:     Thyroid: No thyromegaly.     Trachea: No tracheal deviation.  Cardiovascular:     Rate and Rhythm: Normal rate.  Pulmonary:     Effort: Pulmonary effort is normal.  Abdominal:     Palpations: Abdomen is soft.  Skin:    General: Skin is warm and dry.  Neurological:     Mental Status: She is alert and oriented to person,  place, and time.  Psychiatric:        Behavior: Behavior normal.     Ortho Exam patient has extreme pain cannot do more than touchdown weightbearing left knee.  She is very anxious with palpation of the knee collateral ligaments are stable she has a negative Lachman.  She has 10 to 85 degrees range of motion today limited by severe pain.  No medial joint line tenderness but severe lateral joint line tenderness midportion of the lateral meniscus that extends on either side of the lateral collateral ligament.  No opening with lateral collateral ligament stretching.  Posterior lateral corner is tender without laxity.  No Baker's cyst..    Tib-fib joint is normal. Specialty Comments:  No specialty comments available.  Imaging: CLINICAL DATA:  Injury  EXAM: LEFT KNEE - COMPLETE 4+ VIEW  COMPARISON:  None.  FINDINGS: No evidence of fracture,  dislocation, or joint effusion. No evidence of arthropathy or other focal bone abnormality. Soft tissues are unremarkable. Superior patellar enthesophyte.  IMPRESSION: Negative.   Electronically Signed   By: Feliberto Harts MD   On: 06/10/2020 14:57   PMFS History: Patient Active Problem List   Diagnosis Date Noted  . Acute lateral meniscal tear 06/18/2020  . Nasal polyp - anterior 01/02/2016  . Tobacco use disorder 12/22/2015  . Asthma, mild persistent 02/26/2015   Past Medical History:  Diagnosis Date  . Allergy   . Asthma   . Environmental allergies   . Plantar fasciitis    Bilateral  . Seasonal allergies     Family History  Problem Relation Age of Onset  . Macular degeneration Mother        Living  . Vascular Disease Mother   . Stroke Father 89       Deceased  . Other Father        Borderline Diabetes  . Lung cancer Maternal Uncle        #1  . Heart attack Maternal Uncle        Deceased at 65, #2  . Skin cancer Maternal Uncle        #1  . HIV/AIDS Brother        #1  . Healthy Brother        #2  . Healthy Sister        x2  . Heart defect Son        Bicuspid Valve  . Healthy Son        #2  . Healthy Daughter        x1  . Allergies Son        #1    Past Surgical History:  Procedure Laterality Date  . CESAREAN SECTION    . FOOT SURGERY     Right  . TONSILLECTOMY     Social History   Occupational History  . Not on file  Tobacco Use  . Smoking status: Current Some Day Smoker    Packs/day: 0.10    Types: Cigarettes  . Smokeless tobacco: Never Used  . Tobacco comment: 1 cigarette per day  Substance and Sexual Activity  . Alcohol use: Yes    Alcohol/week: 8.0 standard drinks    Types: 8 Standard drinks or equivalent per week  . Drug use: No  . Sexual activity: Yes

## 2020-06-19 ENCOUNTER — Telehealth: Payer: Self-pay | Admitting: *Deleted

## 2020-06-19 NOTE — Telephone Encounter (Signed)
Not workers comp, husband says to file under [employer]. Also please call husband with any scheduling for [name] at [phone]

## 2020-06-19 NOTE — Telephone Encounter (Signed)
Called and left vm for pt to return call,  Need to know if pt is filing Workers Comp for MRI knee or private insurance.

## 2020-06-23 ENCOUNTER — Telehealth: Payer: Self-pay

## 2020-06-23 NOTE — Telephone Encounter (Signed)
Patient called in wanting to ask questins regarding walking on her ankle  Says she in slight of pain

## 2020-06-24 NOTE — Telephone Encounter (Signed)
IC s/w patient. She states her MRI is still pending. She is concerned that she has increased swelling. She has started to [facility] a little more with use of crutches. She has been taking motrin/icing and elevating as needed. 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

## 2020-06-24 NOTE — Telephone Encounter (Signed)
I called discussed. MRI still pending.

## 2020-06-27 ENCOUNTER — Telehealth: Payer: Self-pay | Admitting: Orthopaedic Surgery

## 2020-06-27 ENCOUNTER — Other Ambulatory Visit: Payer: Self-pay | Admitting: Orthopaedic Surgery

## 2020-06-27 MED ORDER — TRAMADOL HCL 50 MG PO TABS
50.0000 mg | ORAL_TABLET | Freq: Two times a day (BID) | ORAL | 0 refills | Status: DC | PRN
Start: 2020-06-27 — End: 2020-07-02

## 2020-06-27 NOTE — Telephone Encounter (Signed)
Patient's husband called. Says she needs some pain medication called in for her until the MRI is done. His call back number is [phone]

## 2020-06-27 NOTE — Telephone Encounter (Signed)
Done , I called husband and he will pick it up. FYI

## 2020-06-27 NOTE — Telephone Encounter (Signed)
Please advise 

## 2020-07-02 ENCOUNTER — Encounter: Payer: Self-pay | Admitting: Orthopaedic Surgery

## 2020-07-02 ENCOUNTER — Ambulatory Visit (INDEPENDENT_AMBULATORY_CARE_PROVIDER_SITE_OTHER): Payer: No Typology Code available for payment source | Admitting: Orthopaedic Surgery

## 2020-07-02 DIAGNOSIS — S82143A Displaced bicondylar fracture of unspecified tibia, initial encounter for closed fracture: Secondary | ICD-10-CM | POA: Diagnosis not present

## 2020-07-02 MED ORDER — TRAMADOL HCL 50 MG PO TABS
50.0000 mg | ORAL_TABLET | Freq: Two times a day (BID) | ORAL | 0 refills | Status: DC | PRN
Start: 2020-07-02 — End: 2020-07-04

## 2020-07-02 NOTE — Progress Notes (Signed)
Office Visit Note   Patient: Jane Spencer           Date of Birth: 10-Apr-1966           MRN: [id number] Visit Date: 07/02/2020              Requested by: Pearline Cables, MD [address] [place],  [place] [address] PCP: Pearline Cables, MD   Assessment & Plan: Visit Diagnoses:  1. Tibial plateau fracture, unspecified laterality, closed, initial encounter     Plan: GLOBAL   .  Patient is placed in a Bledsoe brace placed at 45-90.  Nonweightbearing crutches.  Recheck 3 weeks.  We will repeat x-rays AP and lateral of her knee on return.  Tramadol renewed.  Compartments remain soft.  Follow-Up Instructions: Return in about 3 weeks (around 07/23/2020).   Orders:  No orders of the defined types were placed in this encounter.  Meds ordered this encounter  Medications  . traMADol (ULTRAM) 50 MG tablet    Sig: Take 1 tablet (50 mg total) by mouth every 12 (twelve) hours as needed.    Dispense:  30 tablet    Refill:  0      Procedures: No procedures performed   Clinical Data: No additional findings.   Subjective: Chief Complaint  Patient presents with  . 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  Initial x-rays were negative and the MRI scan was obtained which shows nondisplaced lateral tibial plateau fracture without depression.  Patient brought CD today we reviewed it.  She has been on crutches nonweightbearing.  I called her about her results.  Review of Systems reviewed updated unchanged.   Objective: Vital Signs: Ht 5\' 1"  (1.549 m)   Wt 136 lb (61.7 kg)   BMI 25.70 kg/m   Physical Exam Constitutional:      Appearance: She is well-developed.  HENT:     Head: Normocephalic.     Right Ear: External ear normal.     Left Ear: External ear normal.  Eyes:     Pupils: Pupils are equal, round, and reactive  to light.  Neck:     Thyroid: No thyromegaly.     Trachea: No tracheal deviation.  Cardiovascular:     Rate and Rhythm: Normal rate.  Pulmonary:     Effort: Pulmonary effort is normal.  Abdominal:     Palpations: Abdomen is soft.  Skin:    General: Skin is warm and dry.  Neurological:     Mental Status: She is alert and oriented to person, place, and time.  Psychiatric:        Behavior: Behavior normal.     Ortho Exam mild knee effusion tenderness about the knee. Specialty Comments:  No specialty comments available.  Imaging: MRI LEFT KNEE WITHOUT CONTRAST, 06/29/2020 11:41 AM   INDICATION: \ M25.562 Left knee pain, unspecified chronicity  COMPARISON: None.   TECHNIQUE: Multi-planar, multi-sequence MR imaging of the left knee was performed without contrast.   STUDY LIMITATIONS: None.   FINDINGS:   . Bone: Nondisplaced fracture of the lateral tibial plateau (series 9, image 20) (series 8, image 20). Associated reactive edema in the lateral aspect of the proximal tibia.  . Cruciate ligaments: Intact.  . Collateral ligaments: Intact.  . Medial meniscus: Increased signal which does  not meet MRI criteria for tear.  . Lateral meniscus: Intact.  . Medial compartment: No deep chondral defect.  . Lateral compartment: No deep chondral defect.  . Patellofemoral compartment: Areas of intermediate grade chondrosis.  Marland Kitchen Extensor mechanism: Mild proximal patellar tendinosis.  . Joint: No malalignment. No effusion.  . Soft tissues: No Baker cyst. Procedure Note  Varney Baas, MD - 06/30/2020  Formatting of this note might be different from the original.  MRI LEFT KNEE WITHOUT CONTRAST, 06/29/2020 11:41 AM   INDICATION: \ M25.562 Left knee pain, unspecified chronicity  COMPARISON: None.   TECHNIQUE: Multi-planar, multi-sequence MR imaging of the left knee was performed without contrast.   STUDY LIMITATIONS: None.   FINDINGS:   . Bone: Nondisplaced  fracture of the lateral tibial plateau (series 9, image 20) (series 8, image 20). Associated reactiveedema in the lateral aspect of the proximal tibia.  . Cruciate ligaments: Intact.  . Collateral ligaments: Intact.  . Medial meniscus: Increased signal which does not meet MRI criteria for tear.  . Lateral meniscus: Intact.  . Medial compartment: No deep chondral defect.  . Lateral compartment: No deep chondral defect.  . Patellofemoral compartment: Areas of intermediate grade chondrosis.  Marland Kitchen Extensor mechanism: Mild proximal patellar tendinosis.  . Joint: No malalignment. No effusion.  . Softtissues: No Baker cyst.   CONCLUSION:   1. Nondisplaced impaction fracture of the lateral tibial plateau.  2. Mild proximal patellar tendinosis.  3. Areas of intermediate grade patellofemoral chondrosis. Specimen Collected: 06/30/20 11:42 AM Last Resulted: 06/30/20 5:58 PM  Received From: [facility]  Result Received: 07/01/20 11:05 AM  Encounter Summary      PMFS History: Patient Active Problem List   Diagnosis Date Noted  . Tibial plateau fracture, unspecified laterality, closed, initial encounter 07/02/2020  . Acute lateral meniscal tear 06/18/2020  . Nasal polyp - anterior 01/02/2016  . Tobacco use disorder 12/22/2015  . Asthma, mild persistent 02/26/2015   Past Medical History:  Diagnosis Date  . Allergy   . Asthma   . Environmental allergies   . Plantar fasciitis    Bilateral  . Seasonal allergies     Family History  Problem Relation Age of Onset  . Macular degeneration Mother        Living  . Vascular Disease Mother   . Stroke Father 37       Deceased  . Other Father        Borderline Diabetes  . Lung cancer Maternal Uncle        #1  . Heart attack Maternal Uncle        Deceased at 34, #2  . Skin cancer Maternal Uncle        #1  . HIV/AIDS Brother        #1  . Healthy Brother        #2  . Healthy Sister        x2  . Heart  defect Son        Bicuspid Valve  . Healthy Son        #2  . Healthy Daughter        x1  . Allergies Son        #1    Past Surgical History:  Procedure Laterality Date  . CESAREAN SECTION    . FOOT SURGERY     Right  . TONSILLECTOMY     Social History   Occupational History  . Not on file  Tobacco Use  . Smoking status: Current Some Day Smoker    Packs/day: 0.10    Types: Cigarettes  . Smokeless tobacco: Never Used  . Tobacco comment: 1 cigarette per day  Substance and Sexual Activity  . Alcohol use: Yes    Alcohol/week: 8.0 standard drinks    Types: 8 Standard drinks or equivalent per week  . Drug use: No  . Sexual activity: Yes

## 2020-07-04 ENCOUNTER — Other Ambulatory Visit: Payer: Self-pay | Admitting: Orthopaedic Surgery

## 2020-07-04 ENCOUNTER — Telehealth: Payer: Self-pay | Admitting: Orthopaedic Surgery

## 2020-07-04 NOTE — Telephone Encounter (Signed)
E-prescribe down. I called to pharmacy.

## 2020-07-04 NOTE — Telephone Encounter (Signed)
Please advise 

## 2020-07-04 NOTE — Telephone Encounter (Signed)
I called to pharmacy. I called patient's husband and advised. E-presribe is down throughout the system. Medication refill info left on pharmacy voicemail.

## 2020-07-04 NOTE — Telephone Encounter (Signed)
Patient's husband called. Says the pharmacy does not have the RX. Would like it called in.

## 2020-07-07 ENCOUNTER — Telehealth: Payer: Self-pay | Admitting: Orthopaedic Surgery

## 2020-07-07 NOTE — Telephone Encounter (Signed)
3 months should be great thank you

## 2020-07-07 NOTE — Telephone Encounter (Signed)
Handicap placard sheet completed. Waiting for signature from Dr. Ophelia Charter and will mail.

## 2020-07-07 NOTE — Telephone Encounter (Signed)
Please advise. OK for handicap placard? For how long?

## 2020-07-07 NOTE — Telephone Encounter (Signed)
Patient called. She would like a temporary handicap sign. Her call back number is [phone]. She said it could be mailed to her.

## 2020-07-23 ENCOUNTER — Encounter: Payer: Self-pay | Admitting: Orthopaedic Surgery

## 2020-07-23 ENCOUNTER — Ambulatory Visit: Payer: Self-pay

## 2020-07-23 ENCOUNTER — Other Ambulatory Visit: Payer: Self-pay

## 2020-07-23 ENCOUNTER — Ambulatory Visit (INDEPENDENT_AMBULATORY_CARE_PROVIDER_SITE_OTHER): Payer: No Typology Code available for payment source | Admitting: Orthopaedic Surgery

## 2020-07-23 VITALS — Ht 61.0 in | Wt 136.0 lb

## 2020-07-23 DIAGNOSIS — S82143A Displaced bicondylar fracture of unspecified tibia, initial encounter for closed fracture: Secondary | ICD-10-CM

## 2020-07-23 DIAGNOSIS — S82142A Displaced bicondylar fracture of left tibia, initial encounter for closed fracture: Secondary | ICD-10-CM

## 2020-07-23 NOTE — Progress Notes (Signed)
Office Visit Note   Patient: Jane Spencer           Date of Birth: 01/17/1966           MRN: [id number] Visit Date: 07/23/2020              Requested by: Pearline Cables, MD [address] [place],  [place] [address] PCP: Pearline Cables, MD   Assessment & Plan: Visit Diagnoses:  1. Tibial plateau fracture, unspecified laterality, closed, initial encounter     Plan: Patient taking Aleve for pain x-rays show nondisplaced fracture is unchanged after a fall.  She still has problem all to take in case she has significant increased pain.  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  I will recheck her in 3 weeks.  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  Recheck 3 weeks.  Patient's anxious to get back to work is understandable.  Continue crutches continue nonweightbearing and recheck 3 weeks.  We will obtain a single AP x-ray on return left knee.  Follow-Up Instructions: Return in about 1 week (around 07/30/2020).   Orders:  Orders Placed This Encounter  Procedures  . XR Knee 1-2 Views Left   No orders of the defined types were placed in this encounter.     Procedures: No procedures performed   Clinical Data: No additional findings.   Subjective: Chief Complaint  Patient presents with  . 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  This is well described in my previous note on 07/02/2020 and includes the MRI report on 06/29/2020 which describes the nondisplaced lateral tibial plateau fracture.  Patient had 1 falling episodes fortunately she was in a Bledsoe.  She had some increased pain in her knee and with her recent fall 2 new x-rays were obtained today which shows position and alignment is  maintained.  Review of Systems 14 point system updated unchanged.  Objective: Vital Signs: Ht 5\' 1"  (1.549 m)   Wt 136 lb (61.7 kg)   BMI 25.70 kg/m   Physical Exam Constitutional:      Appearance: She is well-developed.  HENT:     Head: Normocephalic.     Right Ear: External ear normal.     Left Ear: External ear normal.  Eyes:     Pupils: Pupils are equal, round, and reactive to light.  Neck:     Thyroid: No thyromegaly.     Trachea: No tracheal deviation.  Cardiovascular:     Rate and Rhythm: Normal rate.  Pulmonary:     Effort: Pulmonary effort is normal.  Abdominal:     Palpations: Abdomen is soft.  Skin:    General: Skin is warm and dry.  Neurological:     Mental Status: She is alert and oriented to person, place, and time.  Psychiatric:        Behavior: Behavior normal.     Ortho Exam patient is in a Bledsoe brace.  She has mild knee hemarthrosis.  Hip range of motion is normal.  Skin is intact.  Specialty Comments:  No specialty comments available.  Imaging: XR Knee 1-2 Views Left  Result Date: 07/23/2020 AP lateral knee x-rays are obtained and reviewed.  This shows no displacement of the lateral tibial plateau.  Alignment is anatomic.  Knee effusion is noted suprapatellar pouch. Impression: Nondisplaced left  lateral tibial plateau fracture unchanged from previous MRI scan    PMFS History: Patient Active Problem List   Diagnosis Date Noted  . Tibial plateau fracture, unspecified laterality, closed, initial encounter 07/02/2020  . Acute lateral meniscal tear 06/18/2020  . Nasal polyp - anterior 01/02/2016  . Tobacco use disorder 12/22/2015  . Asthma, mild persistent 02/26/2015   Past Medical History:  Diagnosis Date  . Allergy   . Asthma   . Environmental allergies   . Plantar fasciitis    Bilateral  . Seasonal allergies     Family History  Problem Relation Age of Onset  . Macular degeneration Mother        Living  . Vascular Disease  Mother   . Stroke Father 49       Deceased  . Other Father        Borderline Diabetes  . Lung cancer Maternal Uncle        #1  . Heart attack Maternal Uncle        Deceased at 25, #2  . Skin cancer Maternal Uncle        #1  . HIV/AIDS Brother        #1  . Healthy Brother        #2  . Healthy Sister        x2  . Heart defect Son        Bicuspid Valve  . Healthy Son        #2  . Healthy Daughter        x1  . Allergies Son        #1    Past Surgical History:  Procedure Laterality Date  . CESAREAN SECTION    . FOOT SURGERY     Right  . TONSILLECTOMY     Social History   Occupational History  . Not on file  Tobacco Use  . Smoking status: Current Some Day Smoker    Packs/day: 0.10    Types: Cigarettes  . Smokeless tobacco: Never Used  . Tobacco comment: 1 cigarette per day  Substance and Sexual Activity  . Alcohol use: Yes    Alcohol/week: 8.0 standard drinks    Types: 8 Standard drinks or equivalent per week  . Drug use: No  . Sexual activity: Yes

## 2020-08-13 ENCOUNTER — Ambulatory Visit (INDEPENDENT_AMBULATORY_CARE_PROVIDER_SITE_OTHER): Payer: No Typology Code available for payment source | Admitting: Orthopaedic Surgery

## 2020-08-13 ENCOUNTER — Encounter: Payer: Self-pay | Admitting: Orthopaedic Surgery

## 2020-08-13 ENCOUNTER — Ambulatory Visit (INDEPENDENT_AMBULATORY_CARE_PROVIDER_SITE_OTHER): Payer: No Typology Code available for payment source

## 2020-08-13 VITALS — Ht 61.0 in | Wt 136.0 lb

## 2020-08-13 DIAGNOSIS — S82142A Displaced bicondylar fracture of left tibia, initial encounter for closed fracture: Secondary | ICD-10-CM

## 2020-08-13 DIAGNOSIS — S82143A Displaced bicondylar fracture of unspecified tibia, initial encounter for closed fracture: Secondary | ICD-10-CM

## 2020-08-13 NOTE — Progress Notes (Signed)
Post-Op Visit Note   Patient: Jane Spencer           Date of Birth: 09-13-1966           MRN: [id number] Visit Date: 08/13/2020 PCP: Pearline Cables, MD   Assessment & Plan: Follow-up tibial plateau fracture.  Work slip given no work x2 weeks she has been completely nonweightbearing.  She can begin 50% weightbearing and gradually progress as tolerated and discontinue the Bledsoe brace.  Work slip given no work x2 weeks.  Recheck 2 weeks. Chief Complaint:  Chief Complaint  Patient presents with  . Left Knee - Follow-up, Fracture   Visit Diagnoses:  1. Tibial plateau fracture, unspecified laterality, closed, initial encounter     Plan: Patient had on-the-job injury as an Mudlogger.  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  X-ray today shows normal anatomy of the lateral tibial plateau and the fracture line is not visualized that was well seen on MRI scan.  She has good range of motion swelling is down and gradually can begin weightbearing now and progress with 50%.  Work slip given no work x2 weeks.  Follow-Up Instructions: Return in about 2 weeks (around 08/27/2020).   Orders:  Orders Placed This Encounter  Procedures  . XR Knee 1-2 Views Left   No orders of the defined types were placed in this encounter.   Imaging: No results found.  PMFS History: Patient Active Problem List   Diagnosis Date Noted  . Tibial plateau fracture, unspecified laterality, closed, initial encounter 07/02/2020  . Acute lateral meniscal tear 06/18/2020  . Nasal polyp - anterior 01/02/2016  . Tobacco use disorder 12/22/2015  . Asthma, mild persistent 02/26/2015   Past Medical History:  Diagnosis Date  . Allergy   . Asthma   . Environmental allergies   . Plantar fasciitis    Bilateral  . Seasonal allergies     Family History  Problem Relation Age of Onset  . Macular degeneration Mother        Living  . Vascular Disease  Mother   . Stroke Father 72       Deceased  . Other Father        Borderline Diabetes  . Lung cancer Maternal Uncle        #1  . Heart attack Maternal Uncle        Deceased at 23, #2  . Skin cancer Maternal Uncle        #1  . HIV/AIDS Brother        #1  . Healthy Brother        #2  . Healthy Sister        x2  . Heart defect Son        Bicuspid Valve  . Healthy Son        #2  . Healthy Daughter        x1  . Allergies Son        #1    Past Surgical History:  Procedure Laterality Date  . CESAREAN SECTION    . FOOT SURGERY     Right  . TONSILLECTOMY     Social History   Occupational History  . Not on file  Tobacco Use  . Smoking status: Current Some Day Smoker    Packs/day: 0.10    Types: Cigarettes  . Smokeless tobacco: Never Used  . Tobacco comment: 1 cigarette  per day  Substance and Sexual Activity  . Alcohol use: Yes    Alcohol/week: 8.0 standard drinks    Types: 8 Standard drinks or equivalent per week  . Drug use: No  . Sexual activity: Yes

## 2020-08-27 ENCOUNTER — Encounter: Payer: Self-pay | Admitting: Orthopaedic Surgery

## 2020-08-27 ENCOUNTER — Other Ambulatory Visit: Payer: Self-pay

## 2020-08-27 ENCOUNTER — Ambulatory Visit (INDEPENDENT_AMBULATORY_CARE_PROVIDER_SITE_OTHER): Payer: No Typology Code available for payment source | Admitting: Orthopaedic Surgery

## 2020-08-27 VITALS — Ht 61.0 in | Wt 136.0 lb

## 2020-08-27 DIAGNOSIS — S82143A Displaced bicondylar fracture of unspecified tibia, initial encounter for closed fracture: Secondary | ICD-10-CM

## 2020-08-27 NOTE — Progress Notes (Signed)
Post-Op Visit Note   Patient: Jane Spencer           Date of Birth: April 12, 1966           MRN: [id number] Visit Date: 08/27/2020 PCP: Pearline Cables, MD   Assessment & Plan: Return visit 4 weeks.  Chief Complaint:  Chief Complaint  Patient presents with  . Left Knee - Fracture, Follow-up   Visit Diagnoses: Follow-up tibial plateau fracture noted on MRI but not initial plain radiographs. She still percent weightbearing now the blood so she is using a single crutch. She will work on full extension she can work on knee extension exercises then add her purse strap to her ankle as the counterweight and gradually increase. She can progress to 1 crutch gradually increasing weightbearing. 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 Work slip given no work x4 weeks. Recheck 4 weeks.  Plan: Return 4 weeks. Anticipate MMI in about 2 months.  Follow-Up Instructions: Return in about 4 weeks (around 09/24/2020).   Orders:  No orders of the defined types were placed in this encounter.  No orders of the defined types were placed in this encounter.   Imaging: No results found.  PMFS History: Patient Active Problem List   Diagnosis Date Noted  . Tibial plateau fracture, unspecified laterality, closed, initial encounter 07/02/2020  . Acute lateral meniscal tear 06/18/2020  . Nasal polyp - anterior 01/02/2016  . Tobacco use disorder 12/22/2015  . Asthma, mild persistent 02/26/2015   Past Medical History:  Diagnosis Date  . Allergy   . Asthma   . Environmental allergies   . Plantar fasciitis    Bilateral  . Seasonal allergies     Family History  Problem Relation Age of Onset  . Macular degeneration Mother        Living  . Vascular Disease Mother   . Stroke Father 72       Deceased  . Other Father        Borderline Diabetes  . Lung cancer Maternal Uncle        #1  . Heart attack Maternal Uncle         Deceased at 66, #2  . Skin cancer Maternal Uncle        #1  . HIV/AIDS Brother        #1  . Healthy Brother        #2  . Healthy Sister        x2  . Heart defect Son        Bicuspid Valve  . Healthy Son        #2  . Healthy Daughter        x1  . Allergies Son        #1    Past Surgical History:  Procedure Laterality Date  . CESAREAN SECTION    . FOOT SURGERY     Right  . TONSILLECTOMY     Social History   Occupational History  . Not on file  Tobacco Use  . Smoking status: Current Some Day Smoker    Packs/day: 0.10    Types: Cigarettes  . Smokeless tobacco: Never Used  . Tobacco comment: 1 cigarette per day  Substance and Sexual Activity  . Alcohol use: Yes    Alcohol/week: 8.0 standard drinks    Types: 8 Standard drinks or equivalent per week  . Drug  use: No  . Sexual activity: Yes

## 2020-08-29 ENCOUNTER — Telehealth: Payer: Self-pay | Admitting: Radiology

## 2020-08-29 NOTE — Telephone Encounter (Signed)
When patient was in the office, she stated that her case manager had been changed to someone else and that her previous case manager was no longer there. She gave me two fax numbers to try and fax out of work note to, however, I am unable to get the fax to go through on either of them. Could you please reach out to W/C adjustor and see which fax number I should be using to send her stuff through?  The numbers that she has given me are [phone] and [phone].  Thanks for your help.

## 2020-09-05 ENCOUNTER — Other Ambulatory Visit: Payer: Self-pay | Admitting: Family Medicine

## 2020-09-05 DIAGNOSIS — J302 Other seasonal allergic rhinitis: Secondary | ICD-10-CM

## 2020-09-24 ENCOUNTER — Ambulatory Visit (INDEPENDENT_AMBULATORY_CARE_PROVIDER_SITE_OTHER): Payer: No Typology Code available for payment source

## 2020-09-24 ENCOUNTER — Ambulatory Visit (INDEPENDENT_AMBULATORY_CARE_PROVIDER_SITE_OTHER): Payer: No Typology Code available for payment source | Admitting: Orthopaedic Surgery

## 2020-09-24 ENCOUNTER — Encounter: Payer: Self-pay | Admitting: Orthopaedic Surgery

## 2020-09-24 VITALS — BP 143/79 | HR 64 | Ht 61.0 in | Wt 136.0 lb

## 2020-09-24 DIAGNOSIS — S82142A Displaced bicondylar fracture of left tibia, initial encounter for closed fracture: Secondary | ICD-10-CM

## 2020-09-24 DIAGNOSIS — S82143A Displaced bicondylar fracture of unspecified tibia, initial encounter for closed fracture: Secondary | ICD-10-CM

## 2020-09-24 NOTE — Progress Notes (Signed)
Office Visit Note   Patient: Jane Spencer           Date of Birth: March 16, 1966           MRN: [id number] Visit Date: 09/24/2020              Requested by: Pearline Cables, MD [address] [place],  [place] [address] PCP: Pearline Cables, MD   Assessment & Plan: Visit Diagnoses:  1. Tibial plateau fracture, unspecified laterality, closed, initial encounter     Plan: We will wean her from 1 crutch to 1 cane.  Work slip given for work resumption on 10/14/2019.  She is a Marketing executive teaches kickboxing, step class and other fitness classes.  I plan to recheck her in 8 weeks for final impairment rating.  Work slip given no work until 10/14/2019 and work resumption on 10/14/2019.  We went over exercises she is doing and since she is a Marketing executive she does not require formal physical therapy and has been compliant in all aspects of her care.  Follow-up 8 weeks for likely impairment rating assignment.  Follow-Up Instructions: Return in about 8 weeks (around 11/19/2020).   Orders:  Orders Placed This Encounter  Procedures  . XR Knee 1-2 Views Left   No orders of the defined types were placed in this encounter.     Procedures: No procedures performed   Clinical Data: No additional findings.   Subjective: Chief Complaint  Patient presents with  . Left Knee - Follow-up, Fracture    HPI 55 year old female returns follow-up nondisplaced lateral tibial plateau fracture with negative plain radiographs and positive MRI for nondisplaced lateral tibial plateau fracture.  She has been using one cane with partial weightbearing.  In the house she take several steps without the cane.  With more extensive weightbearing she has had mild discomfort over the lateral tibial plateau region.  She is not noted any swelling in her knee but has noted some swelling in her foot and ankle at the end of the day.  Review of Systems updated unchanged.   Objective: Vital Signs:  BP (!) 143/79   Pulse 64   Ht 5\' 1"  (1.549 m)   Wt 136 lb (61.7 kg)   BMI 25.70 kg/m   Physical Exam Constitutional:      Appearance: She is well-developed.  HENT:     Head: Normocephalic.     Right Ear: External ear normal.     Left Ear: External ear normal.  Eyes:     Pupils: Pupils are equal, round, and reactive to light.  Neck:     Thyroid: No thyromegaly.     Trachea: No tracheal deviation.  Cardiovascular:     Rate and Rhythm: Normal rate.  Pulmonary:     Effort: Pulmonary effort is normal.  Abdominal:     Palpations: Abdomen is soft.  Skin:    General: Skin is warm and dry.  Neurological:     Mental Status: She is alert and oriented to person, place, and time.  Psychiatric:        Mood and Affect: Mood and affect normal.        Behavior: Behavior normal.     Ortho Exam left knee slight tenderness lateral tibial plateau full knee extension good flexion.  No knee effusion collateral ligaments are stable.  ACL PCL exam is normal. Specialty Comments:  No specialty comments available.  Imaging: XR Knee 1-2 Views Left  Result  Date: 09/24/2020 Standing AP both knees lateral left knee obtained and reviewed.  Appearance of is anatomic.  Lateral tibial plateau fracture is not visualized on these images. Impression: Normal left knee x-rays.  Lateral tibial plateau fracture visualized on MRI scan is not seen.    PMFS History: Patient Active Problem List   Diagnosis Date Noted  . Tibial plateau fracture, unspecified laterality, closed, initial encounter 07/02/2020  . Acute lateral meniscal tear 06/18/2020  . Nasal polyp - anterior 01/02/2016  . Tobacco use disorder 12/22/2015  . Asthma, mild persistent 02/26/2015   Past Medical History:  Diagnosis Date  . Allergy   . Asthma   . Environmental allergies   . Plantar fasciitis    Bilateral  . Seasonal allergies     Family History  Problem Relation Age of Onset  . Macular degeneration Mother        Living  .  Vascular Disease Mother   . Stroke Father 78       Deceased  . Other Father        Borderline Diabetes  . Lung cancer Maternal Uncle        #1  . Heart attack Maternal Uncle        Deceased at 64, #2  . Skin cancer Maternal Uncle        #1  . HIV/AIDS Brother        #1  . Healthy Brother        #2  . Healthy Sister        x2  . Heart defect Son        Bicuspid Valve  . Healthy Son        #2  . Healthy Daughter        x1  . Allergies Son        #1    Past Surgical History:  Procedure Laterality Date  . CESAREAN SECTION    . FOOT SURGERY     Right  . TONSILLECTOMY     Social History   Occupational History  . Not on file  Tobacco Use  . Smoking status: Current Some Day Smoker    Packs/day: 0.10    Types: Cigarettes  . Smokeless tobacco: Never Used  . Tobacco comment: 1 cigarette per day  Substance and Sexual Activity  . Alcohol use: Yes    Alcohol/week: 8.0 standard drinks    Types: 8 Standard drinks or equivalent per week  . Drug use: No  . Sexual activity: Yes

## 2020-09-26 IMAGING — CR DG KNEE COMPLETE 4+V*L*
4 series · 4 of 4 positions shown · non-contrast
Comparison: None.

CLINICAL DATA: Injury

EXAM:
LEFT KNEE - COMPLETE 4+ VIEW

[t knee ap left]
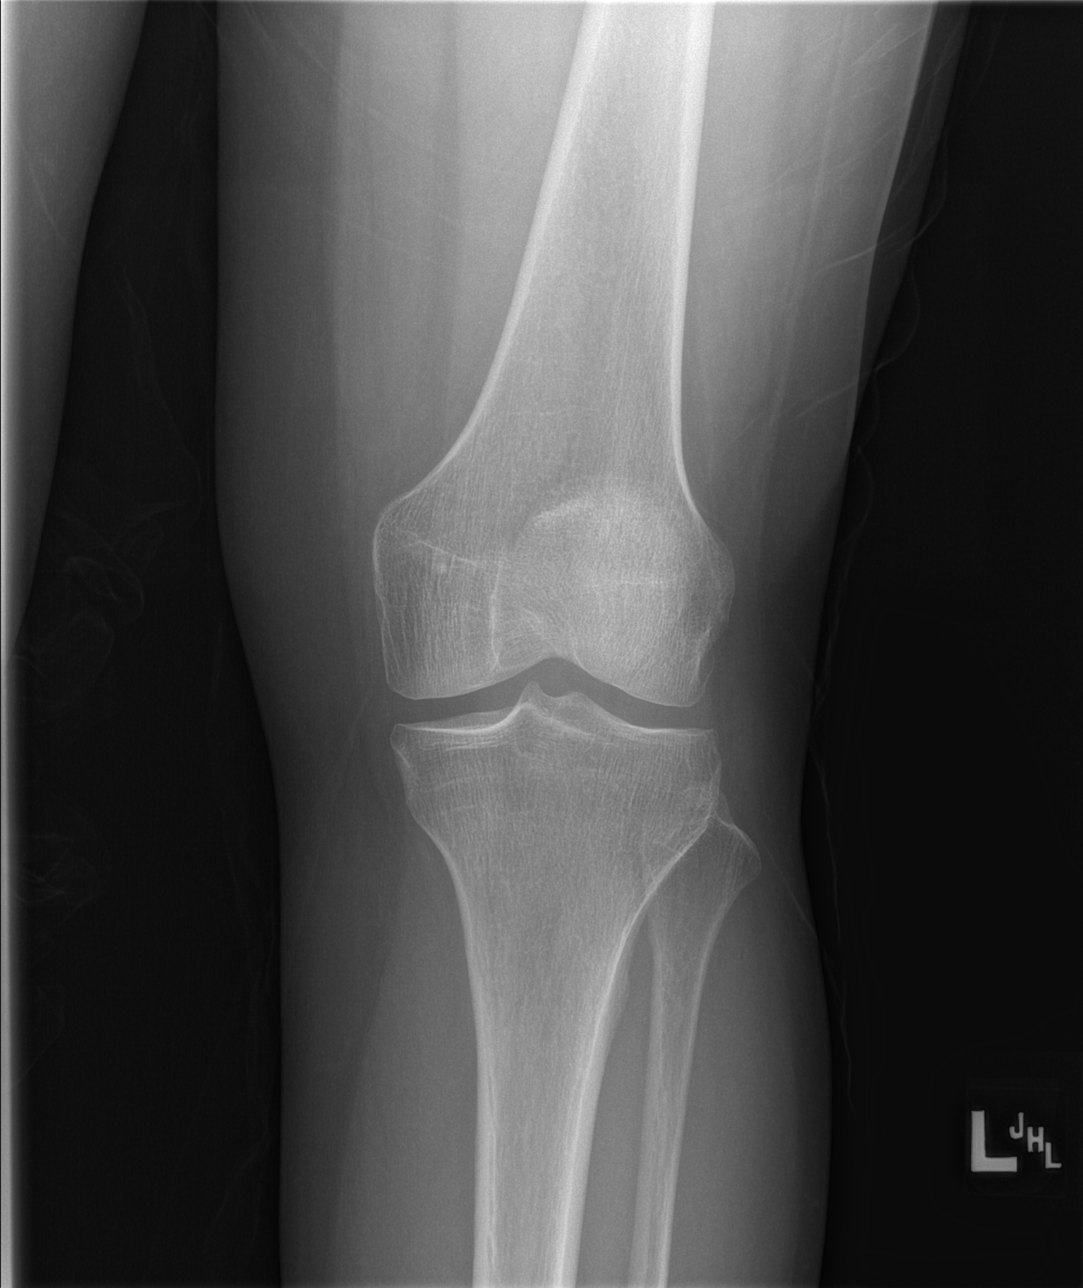

[t knee obl left (1 of 2)]
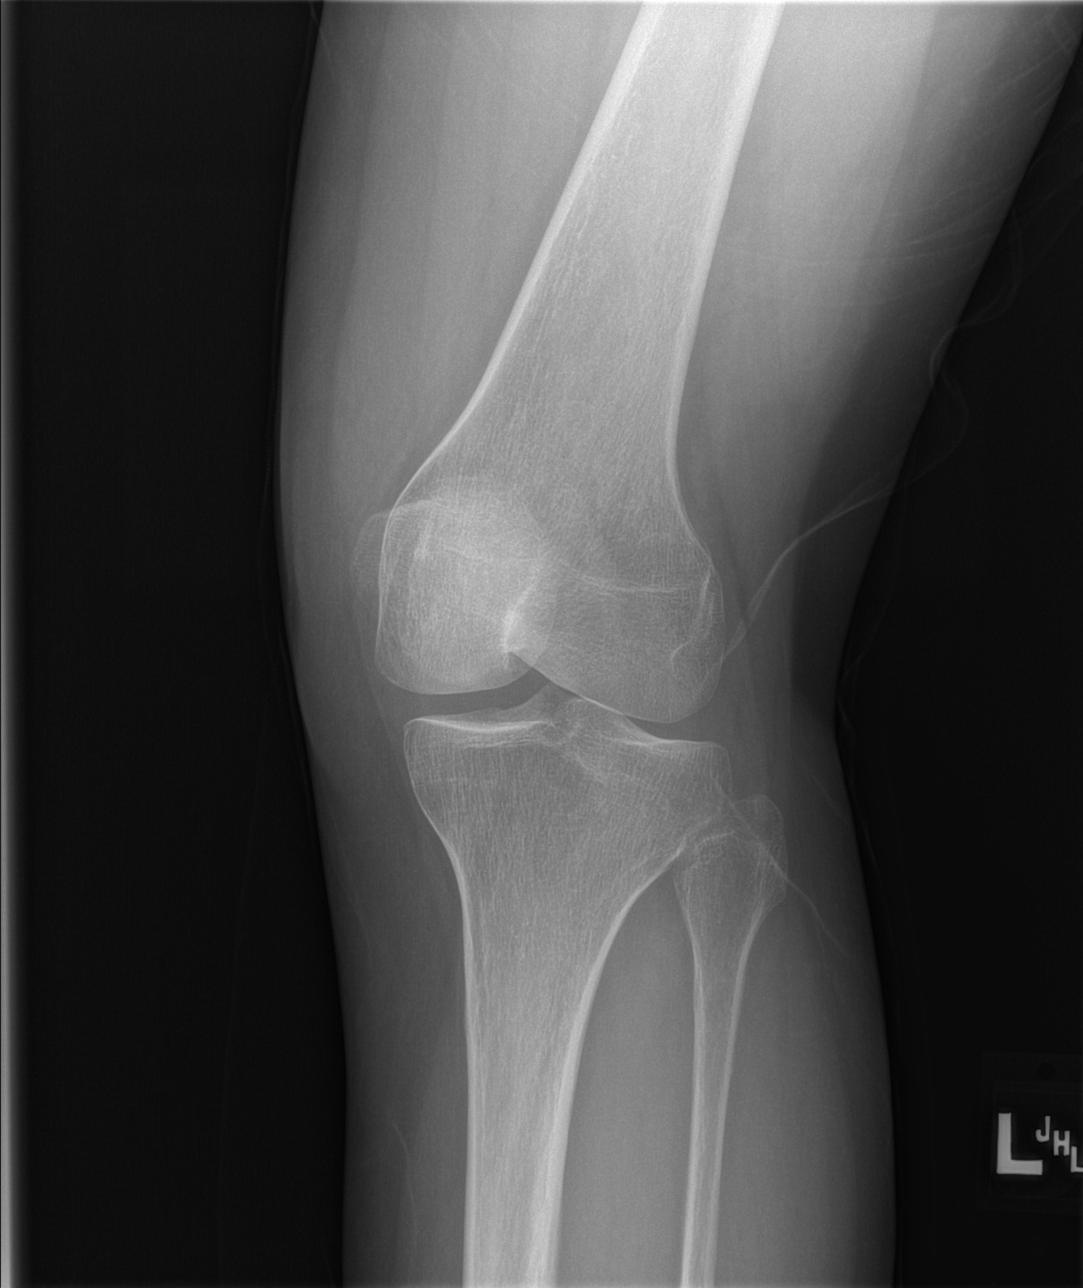

[t knee obl left (2 of 2)]
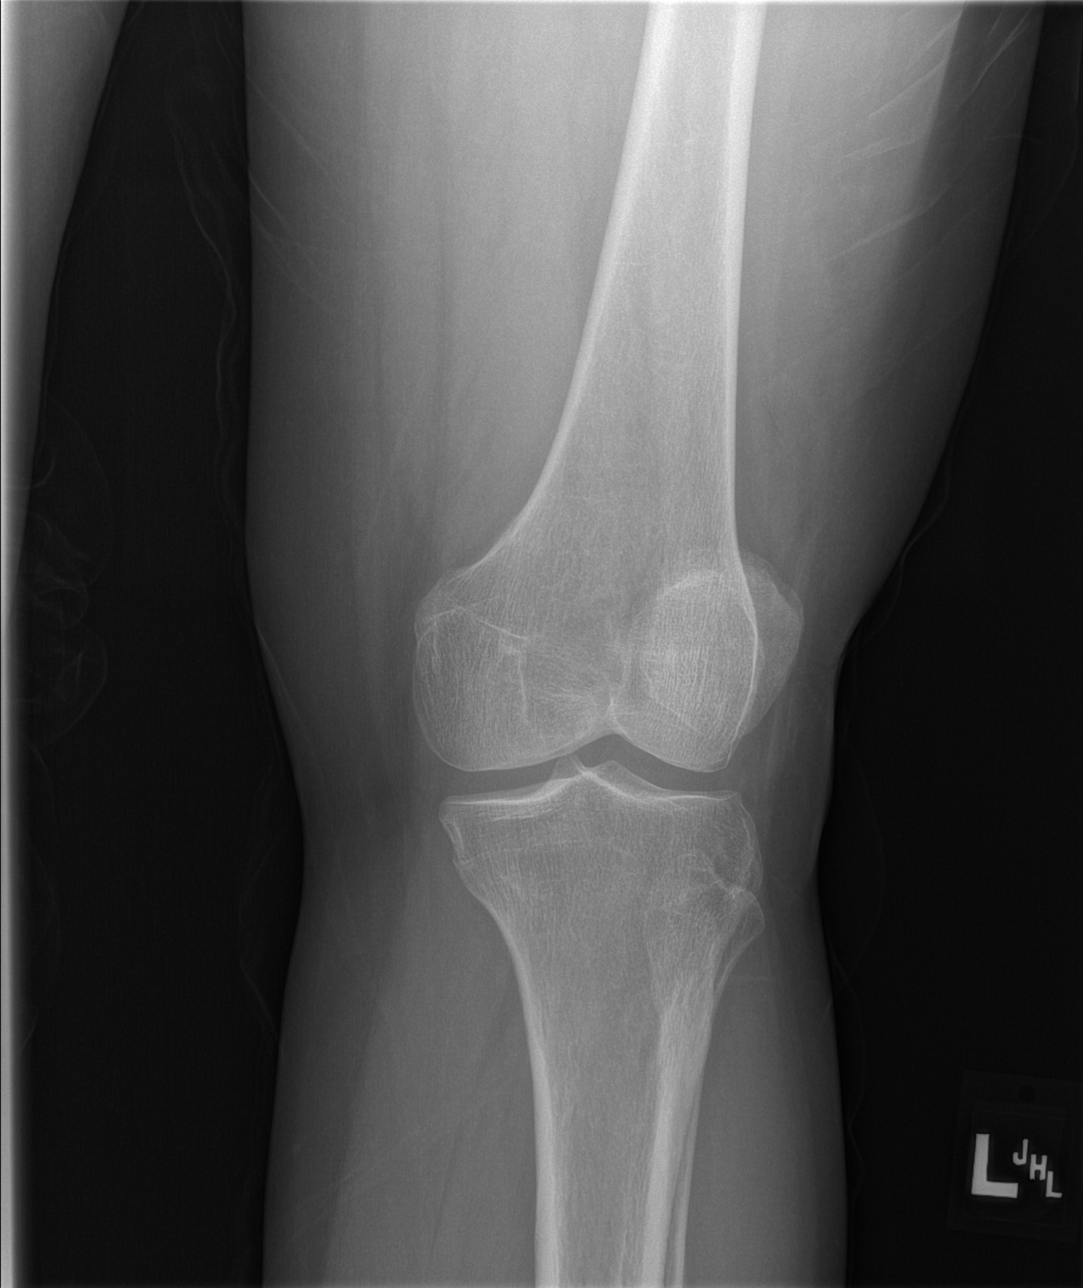

[t knee lat left]
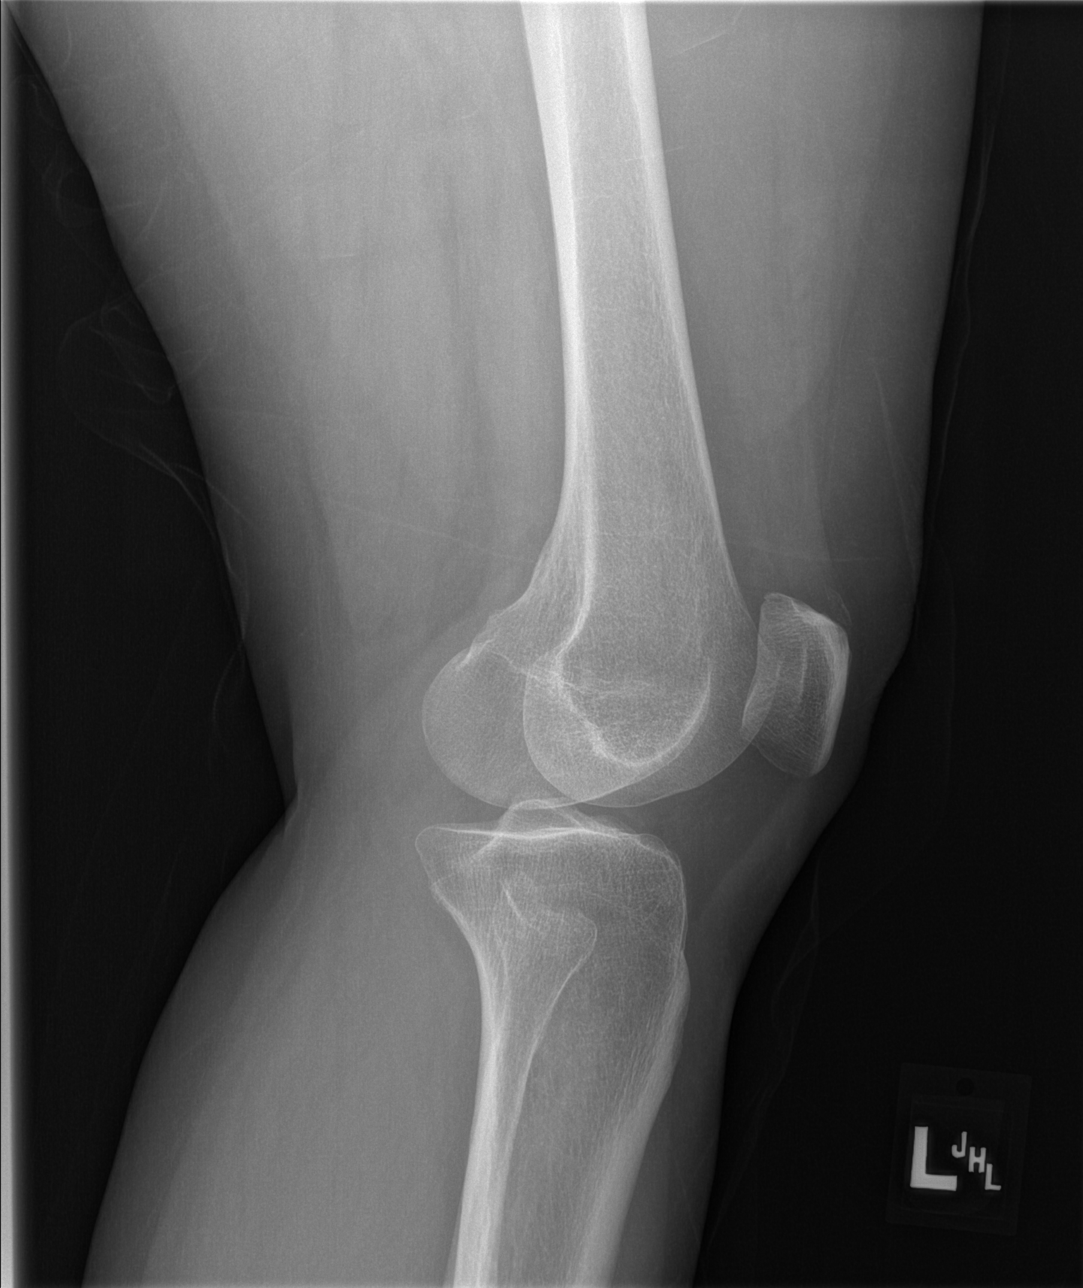

[4 of 4 positions shown; findings below may reference images not displayed]

FINDINGS: No evidence of fracture, dislocation, or joint effusion. No evidence
of arthropathy or other focal bone abnormality. Soft tissues are
unremarkable. Superior patellar enthesophyte.
IMPRESSION: Negative.

## 2020-10-23 ENCOUNTER — Telehealth: Payer: Self-pay | Admitting: Orthopaedic Surgery

## 2020-10-23 NOTE — Telephone Encounter (Signed)
Please advise 

## 2020-10-23 NOTE — Telephone Encounter (Signed)
Patient called requesting a medication for inflammation. Please send to pharmacy on file. Patient phone number is [phone].

## 2020-10-23 NOTE — Telephone Encounter (Signed)
Ucall. Previously on naproxen 500mg  po bid with food a few months ago, ucall if that is fine send in 3 months worth, if not then voltaren 75 mg po bid with food # 60 refill times 3 thanks

## 2020-10-23 NOTE — Telephone Encounter (Signed)
I called patient. Phone picked up and hung up.  Will try again.

## 2020-10-24 ENCOUNTER — Telehealth: Payer: Self-pay

## 2020-10-24 MED ORDER — NAPROXEN 500 MG PO TABS: 500.0000 mg | ORAL_TABLET | Freq: Two times a day (BID) | ORAL | 3 refills | Status: DC

## 2020-10-24 NOTE — Telephone Encounter (Signed)
Patient returned call. Naproxen sent to pharmacy. Patient aware.

## 2020-10-24 NOTE — Addendum Note (Signed)
Addended by: Rogers Seeds on: 10/24/2020 03:50 PM   Modules accepted: Orders

## 2020-10-24 NOTE — Telephone Encounter (Signed)
Sent to pharmacy 

## 2020-10-24 NOTE — Telephone Encounter (Signed)
I left voicemail for patient requesting return call. Would she like Naproxen or does she want to try something else?

## 2020-10-24 NOTE — Telephone Encounter (Signed)
Patient called regarding last message she stated she would like to try naproxen again. CB:[phone]

## 2020-10-27 ENCOUNTER — Telehealth: Payer: Self-pay | Admitting: Family Medicine

## 2020-10-27 DIAGNOSIS — J302 Other seasonal allergic rhinitis: Secondary | ICD-10-CM

## 2020-10-27 DIAGNOSIS — J4530 Mild persistent asthma, uncomplicated: Secondary | ICD-10-CM

## 2020-10-27 NOTE — Telephone Encounter (Signed)
Medication:  cetirizine (ZYRTEC) 10 MG tablet [[id number]]  montelukast (SINGULAIR) 10 MG tablet [[id number]]      Has the patient contacted their pharmacy?  (If no, request that the patient contact the pharmacy for the refill.) (If yes, when and what did the pharmacy advise?)     Preferred Pharmacy (with phone number or street name):    [id number] [place], [place] - [phone] AT [facility] [address] [facility] [address]  [address] [place] [address]  Phone:  [phone] Fax:  [phone]   Agent: Please be advised that RX refills may take up to 3 business days. We ask that you follow-up with your pharmacy.

## 2020-10-28 MED ORDER — CETIRIZINE HCL 10 MG PO TABS: 10.0000 mg | ORAL_TABLET | Freq: Every day | ORAL | 3 refills | Status: DC | PRN

## 2020-10-28 MED ORDER — MONTELUKAST SODIUM 10 MG PO TABS: 10.0000 mg | ORAL_TABLET | Freq: Every day | ORAL | 3 refills | Status: DC

## 2020-10-28 NOTE — Telephone Encounter (Signed)
Medications refilled to pharmacy.  

## 2020-11-21 ENCOUNTER — Ambulatory Visit: Payer: BLUE CROSS/BLUE SHIELD | Admitting: Orthopaedic Surgery

## 2020-11-25 ENCOUNTER — Other Ambulatory Visit: Payer: Self-pay

## 2020-11-25 ENCOUNTER — Ambulatory Visit (INDEPENDENT_AMBULATORY_CARE_PROVIDER_SITE_OTHER): Payer: No Typology Code available for payment source | Admitting: Orthopaedic Surgery

## 2020-11-25 VITALS — BP 130/93 | HR 69 | Ht 61.0 in | Wt 136.0 lb

## 2020-11-25 DIAGNOSIS — S82143A Displaced bicondylar fracture of unspecified tibia, initial encounter for closed fracture: Secondary | ICD-10-CM | POA: Diagnosis not present

## 2020-11-25 NOTE — Progress Notes (Signed)
Office Visit Note   Patient: Jane Spencer           Date of Birth: 1966/09/14           MRN: [id number] Visit Date: 11/25/2020              Requested by: Pearline Cables, MD [address] [place],  [place] [address] PCP: Pearline Cables, MD   Assessment & Plan: Visit Diagnoses:  1. 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  I plan to see her back in a final visit in 2 months and will sign her impairment rating With [employer] act guidelines book for an intra-articular fracture of the tibial plateau which would be 10% of the left leg.  10 presents her pulmonary impairment rating.  Return 2 months for final rating.  Follow-Up Instructions: Return in about 2 months (around 01/25/2021).   Orders:  No orders of the defined types were placed in this encounter.  No orders of the defined types were placed in this encounter.     Procedures: No procedures performed   Clinical Data: No additional findings.   Subjective: Chief Complaint  Patient presents with  . Left Knee - Follow-up, Fracture    HPI 55 year old female returns for follow-up tibial plateau fracture nondisplaced seen on MRI but not seen on plain radiographs initially after on-the-job injury.  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  She is avoid jumping jacks she tried 1 and had some pain.  She has been ambulatory and can walk without a limp she notes some stiffness when she gets up also noticed little bit of fullness in the popliteal region.  Of note was the MRI that showed the nondisplaced fracture did not show Baker's cyst and her menisci were intact. Review of Systems all the systems updated unchanged.   Objective: Vital Signs: BP (!) 130/93 (BP Location: Left Arm, Patient Position: Sitting)   Pulse 69   Ht 5\' 1"   (1.549 m)   Wt 136 lb (61.7 kg)   BMI 25.70 kg/m   Physical Exam Constitutional:      Appearance: She is well-developed.  HENT:     Head: Normocephalic.     Right Ear: External ear normal.     Left Ear: External ear normal.  Eyes:     Pupils: Pupils are equal, round, and reactive to light.  Neck:     Thyroid: No thyromegaly.     Trachea: No tracheal deviation.  Cardiovascular:     Rate and Rhythm: Normal rate.  Pulmonary:     Effort: Pulmonary effort is normal.  Abdominal:     Palpations: Abdomen is soft.  Skin:    General: Skin is warm and dry.  Neurological:     Mental Status: She is alert and oriented to person, place, and time.  Psychiatric:        Mood and Affect: Mood and affect normal.        Behavior: Behavior normal.     Ortho Exam patient collateral cruciate ligament exam is normal.  She has some tenderness slight fullness posteriorly but I do not feel a definite Baker's cyst behind the left knee.  Ankle range of motion is good no pitting edema good range of motion hip range of motion is normal and symmetrical.  Specialty  Comments:  No specialty comments available.  Imaging: No results found.   PMFS History: Patient Active Problem List   Diagnosis Date Noted  . Tibial plateau fracture, unspecified laterality, closed, initial encounter 07/02/2020  . Acute lateral meniscal tear 06/18/2020  . Nasal polyp - anterior 01/02/2016  . Tobacco use disorder 12/22/2015  . Asthma, mild persistent 02/26/2015   Past Medical History:  Diagnosis Date  . Allergy   . Asthma   . Environmental allergies   . Plantar fasciitis    Bilateral  . Seasonal allergies     Family History  Problem Relation Age of Onset  . Macular degeneration Mother        Living  . Vascular Disease Mother   . Stroke Father 4       Deceased  . Other Father        Borderline Diabetes  . Lung cancer Maternal Uncle        #1  . Heart attack Maternal Uncle        Deceased at 66, #2  .  Skin cancer Maternal Uncle        #1  . HIV/AIDS Brother        #1  . Healthy Brother        #2  . Healthy Sister        x2  . Heart defect Son        Bicuspid Valve  . Healthy Son        #2  . Healthy Daughter        x1  . Allergies Son        #1    Past Surgical History:  Procedure Laterality Date  . CESAREAN SECTION    . FOOT SURGERY     Right  . TONSILLECTOMY     Social History   Occupational History  . Not on file  Tobacco Use  . Smoking status: Current Some Day Smoker    Packs/day: 0.10    Types: Cigarettes  . Smokeless tobacco: Never Used  . Tobacco comment: 1 cigarette per day  Substance and Sexual Activity  . Alcohol use: Yes    Alcohol/week: 8.0 standard drinks    Types: 8 Standard drinks or equivalent per week  . Drug use: No  . Sexual activity: Yes

## 2020-12-26 ENCOUNTER — Ambulatory Visit: Payer: BLUE CROSS/BLUE SHIELD | Admitting: Orthopaedic Surgery

## 2021-01-27 ENCOUNTER — Ambulatory Visit: Payer: BLUE CROSS/BLUE SHIELD | Admitting: Orthopaedic Surgery

## 2021-02-17 ENCOUNTER — Telehealth: Payer: Self-pay | Admitting: Family Medicine

## 2021-02-17 ENCOUNTER — Ambulatory Visit (INDEPENDENT_AMBULATORY_CARE_PROVIDER_SITE_OTHER): Payer: BLUE CROSS/BLUE SHIELD | Admitting: Family Medicine

## 2021-02-17 ENCOUNTER — Encounter: Payer: Self-pay | Admitting: Family Medicine

## 2021-02-17 ENCOUNTER — Other Ambulatory Visit: Payer: Self-pay

## 2021-02-17 VITALS — BP 110/80 | HR 78 | Temp 98.5°F | Resp 18 | Ht 61.0 in | Wt 139.2 lb

## 2021-02-17 DIAGNOSIS — H6503 Acute serous otitis media, bilateral: Secondary | ICD-10-CM | POA: Diagnosis not present

## 2021-02-17 DIAGNOSIS — J40 Bronchitis, not specified as acute or chronic: Secondary | ICD-10-CM | POA: Insufficient documentation

## 2021-02-17 MED ORDER — AMOXICILLIN-POT CLAVULANATE 875-125 MG PO TABS: 1.0000 | ORAL_TABLET | Freq: Two times a day (BID) | ORAL | 0 refills | Status: DC

## 2021-02-17 MED ORDER — PREDNISONE 10 MG PO TABS: ORAL_TABLET | ORAL | 0 refills | Status: DC

## 2021-02-17 NOTE — Progress Notes (Signed)
Subjective:   By signing my name below, I, Shehryar Baig, attest that this documentation has been prepared under the direction and in the presence of Dr. Seabron Spates, DO. 02/17/2021      Patient ID: Jane Spencer, female    DOB: 1966/07/10, 55 y.o.   MRN: [id number]  Chief Complaint  Patient presents with  . Ear Pain    Pt states right ear and neck pain. Pt states unable to turn head.   . Neck Pain    HPI Patient is in today for a office visit.  She complains of having right ear pain and neck pain since yesterday. She also had a head cold for 2 weeks. She has a dry cough, laryngitis, shortness of breath, ear pressure, sinus pressure, and had congestion with no drainage. She denied having a fever, wheezing at this time. She is taking zyrtec and Flonase to manage her symptoms but finds no relief. She tested negative for Covid-19.   Past Medical History:  Diagnosis Date  . Allergy   . Asthma   . Environmental allergies   . Plantar fasciitis    Bilateral  . Seasonal allergies     Past Surgical History:  Procedure Laterality Date  . CESAREAN SECTION    . FOOT SURGERY     Right  . TONSILLECTOMY      Family History  Problem Relation Age of Onset  . Macular degeneration Mother        Living  . Vascular Disease Mother   . Stroke Father 66       Deceased  . Other Father        Borderline Diabetes  . Lung cancer Maternal Uncle        #1  . Heart attack Maternal Uncle        Deceased at 63, #2  . Skin cancer Maternal Uncle        #1  . HIV/AIDS Brother        #1  . Healthy Brother        #2  . Healthy Sister        x2  . Heart defect Son        Bicuspid Valve  . Healthy Son        #2  . Healthy Daughter        x1  . Allergies Son        #1    Social History   Socioeconomic History  . Marital status: Married    Spouse name: Not on file  . Number of children: Not on file  . Years of education: Not on file  . Highest education level: Not on file   Occupational History  . Not on file  Tobacco Use  . Smoking status: Current Some Day Smoker    Packs/day: 0.10    Types: Cigarettes  . Smokeless tobacco: Never Used  . Tobacco comment: 1 cigarette per day  Substance and Sexual Activity  . Alcohol use: Yes    Alcohol/week: 8.0 standard drinks    Types: 8 Standard drinks or equivalent per week  . Drug use: No  . Sexual activity: Yes  Other Topics Concern  . Not on file  Social History Narrative  . Not on file   Social Determinants of Health   Financial Resource Strain: Not on file  Food Insecurity: Not on file  Transportation Needs: Not on file  Physical Activity: Not on file  Stress: Not on file  Social Connections: Not on file  Intimate Partner Violence: Not on file    Outpatient Medications Prior to Visit  Medication Sig Dispense Refill  . albuterol (VENTOLIN HFA) 108 (90 Base) MCG/ACT inhaler INHALE 2 PUFFS BY MOUTH EVERY 6 HOURS AS NEEDED FOR COUGH OR WHEEZING 18 g 5  . Albuterol Sulfate 2.5 MG/0.5ML NEBU Inhale 0.5 mLs into the lungs 4 (four) times daily as needed. 20 mL 1  . azelastine (ASTELIN) 0.1 % nasal spray Place 1 spray into both nostrils 2 (two) times daily. Use in each nostril as directed 90 mL 3  . budesonide-formoterol (SYMBICORT) 80-4.5 MCG/ACT inhaler Inhale 2 puffs into the lungs 2 (two) times daily. 3 Inhaler 3  . cetirizine (ZYRTEC) 10 MG tablet Take 1 tablet (10 mg total) by mouth daily as needed for allergies or rhinitis. 90 tablet 3  . fluticasone (FLONASE) 50 MCG/ACT nasal spray INSTILL 1 TO 2 SPRAYS INTO EACH NOSTRIL EVERY DAY 48 mL 11  . montelukast (SINGULAIR) 10 MG tablet TAKE 1 TABLET(10 MG) BY MOUTH AT BEDTIME 30 tablet 0  . montelukast (SINGULAIR) 10 MG tablet Take 1 tablet (10 mg total) by mouth at bedtime. 90 tablet 3  . naproxen (NAPROSYN) 500 MG tablet Take 1 tablet (500 mg total) by mouth 2 (two) times daily with a meal. 60 tablet 3  . traMADol (ULTRAM) 50 MG tablet TAKE 1 TABLET BY  MOUTH TWICE DAILY AS NEEDED FOR PAIN 30 tablet 0   No facility-administered medications prior to visit.    No Known Allergies  Review of Systems  Constitutional: Negative for fever and malaise/fatigue.  HENT: Positive for congestion (Sinus pressure) and sore throat. Ear pain: Right.        (+)Ear pressure  Eyes: Negative for blurred vision.  Respiratory: Positive for cough and shortness of breath. Negative for wheezing.   Cardiovascular: Negative for chest pain, palpitations and leg swelling.  Gastrointestinal: Negative for abdominal pain, blood in stool and nausea.  Genitourinary: Negative for dysuria and frequency.  Musculoskeletal: Positive for neck pain. Negative for falls.  Skin: Negative for rash.  Neurological: Negative for dizziness, loss of consciousness and headaches.  Endo/Heme/Allergies: Negative for environmental allergies.  Psychiatric/Behavioral: Negative for depression. The patient is not nervous/anxious.        Objective:    Physical Exam Vitals and nursing note reviewed.  Constitutional:      General: She is not in acute distress.    Appearance: Normal appearance. She is not ill-appearing.  HENT:     Head: Normocephalic and atraumatic.     Right Ear: External ear normal. Tympanic membrane is erythematous.     Left Ear: External ear normal.     Ears:     Comments: Fluid found in both ears.  Eyes:     Extraocular Movements: Extraocular movements intact.     Pupils: Pupils are equal, round, and reactive to light.  Cardiovascular:     Rate and Rhythm: Normal rate and regular rhythm.     Pulses: Normal pulses.     Heart sounds: Normal heart sounds. No murmur heard. No gallop.   Pulmonary:     Effort: Pulmonary effort is normal. No respiratory distress.     Breath sounds: Normal breath sounds. No wheezing, rhonchi or rales.  Lymphadenopathy:     Cervical: Cervical adenopathy present.  Skin:    General: Skin is warm and dry.  Neurological:     Mental  Status: She is alert and oriented to person,  place, and time.  Psychiatric:        Behavior: Behavior normal.     BP 110/80 (BP Location: Right Arm, Patient Position: Sitting, Cuff Size: Normal)   Pulse 78   Temp 98.5 F (36.9 C) (Oral)   Resp 18   Ht 5\' 1"  (1.549 m)   Wt 139 lb 3.2 oz (63.1 kg)   SpO2 96%   BMI 26.30 kg/m  Wt Readings from Last 3 Encounters:  02/17/21 139 lb 3.2 oz (63.1 kg)  11/25/20 136 lb (61.7 kg)  09/24/20 136 lb (61.7 kg)    Diabetic Foot Exam - Simple   No data filed    Lab Results  Component Value Date   WBC 8.3 04/23/2020   HGB 13.0 04/23/2020   HCT 38.1 04/23/2020   PLT 387.0 04/23/2020   GLUCOSE 98 04/23/2020   CHOL 211 (H) 04/23/2020   TRIG 149.0 04/23/2020   HDL 78.50 04/23/2020   LDLCALC 103 (H) 04/23/2020   ALT 19 04/23/2020   AST 21 04/23/2020   NA 137 04/23/2020   K 4.2 04/23/2020   CL 106 04/23/2020   CREATININE 0.75 04/23/2020   BUN 22 04/23/2020   CO2 24 04/23/2020   TSH 1.09 04/23/2020   HGBA1C 5.4 04/23/2020    Lab Results  Component Value Date   TSH 1.09 04/23/2020   Lab Results  Component Value Date   WBC 8.3 04/23/2020   HGB 13.0 04/23/2020   HCT 38.1 04/23/2020   MCV 95.1 04/23/2020   PLT 387.0 04/23/2020   Lab Results  Component Value Date   NA 137 04/23/2020   K 4.2 04/23/2020   CO2 24 04/23/2020   GLUCOSE 98 04/23/2020   BUN 22 04/23/2020   CREATININE 0.75 04/23/2020   BILITOT 0.4 04/23/2020   ALKPHOS 61 04/23/2020   AST 21 04/23/2020   ALT 19 04/23/2020   PROT 6.9 04/23/2020   ALBUMIN 4.5 04/23/2020   CALCIUM 9.5 04/23/2020   GFR 80.14 04/23/2020   Lab Results  Component Value Date   CHOL 211 (H) 04/23/2020   Lab Results  Component Value Date   HDL 78.50 04/23/2020   Lab Results  Component Value Date   LDLCALC 103 (H) 04/23/2020   Lab Results  Component Value Date   TRIG 149.0 04/23/2020   Lab Results  Component Value Date   CHOLHDL 3 04/23/2020   Lab Results  Component  Value Date   HGBA1C 5.4 04/23/2020       Assessment & Plan:   Problem List Items Addressed This Visit      Unprioritized   Bronchitis    abx per orders pred taper       Relevant Medications   amoxicillin-clavulanate (AUGMENTIN) 875-125 MG tablet   predniSONE (DELTASONE) 10 MG tablet   Non-recurrent acute serous otitis media of both ears - Primary    pred taper  abx per orders       Relevant Medications   amoxicillin-clavulanate (AUGMENTIN) 875-125 MG tablet   predniSONE (DELTASONE) 10 MG tablet       Meds ordered this encounter  Medications  . amoxicillin-clavulanate (AUGMENTIN) 875-125 MG tablet    Sig: Take 1 tablet by mouth 2 (two) times daily.    Dispense:  20 tablet    Refill:  0  . predniSONE (DELTASONE) 10 MG tablet    Sig: TAKE 3 TABLETS PO QD FOR 3 DAYS THEN TAKE 2 TABLETS PO QD FOR 3 DAYS THEN TAKE 1 TABLET  PO QD FOR 3 DAYS THEN TAKE 1/2 TAB PO QD FOR 3 DAYS    Dispense:  20 tablet    Refill:  0    I, Dr. Seabron Spates, DO, personally preformed the services described in this documentation.  All medical record entries made by the scribe were at my direction and in my presence.  I have reviewed the chart and discharge instructions (if applicable) and agree that the record reflects my personal performance and is accurate and complete. 02/17/2021   I,Shehryar Baig,acting as a scribe for Donato Schultz, DO.,have documented all relevant documentation on the behalf of Donato Schultz, DO,as directed by  Donato Schultz, DO while in the presence of Donato Schultz, DO.   Donato Schultz, DO

## 2021-02-17 NOTE — Assessment & Plan Note (Signed)
pred taper  abx per orders

## 2021-02-17 NOTE — Telephone Encounter (Signed)
The patient was seen by Dr. Laury Axon. The prednisone prescription was not delivered to the pharmacy.

## 2021-02-17 NOTE — Patient Instructions (Signed)
Otitis Media, Adult  Otitis media occurs when there is inflammation and fluid in the middle ear space with signs and symptoms of an acute infection. The middle ear is a part of the ear that contains bones for hearing as well as air that helps send sounds to the brain. When infected fluid builds up in this space, it causes pressure and results in symptoms of acute otitis media. The eustachian tube connects the middle ear to the back of the nose (nasopharynx) and normally allows air into the middle ear space. If the eustachian tube becomes blocked, fluid can build up and become infected. What are the causes? This condition is caused by a blockage in the eustachian tube. This can be caused by an object like mucus, or by swelling (edema) of the tube. Problems that can cause a blockage include:  A cold or other upper respiratory infection.  Allergies.  An irritant, such as tobacco smoke.  Enlarged adenoids. The adenoids are areas of soft tissue located high in the back of the throat, behind the nose and the roof of the mouth. They are part of the body's defense system (immune system).  A mass in the nasopharynx.  Damage to the ear caused by pressure changes (barotrauma). What are the signs or symptoms? Symptoms of this condition include:  Ear pain.  Fever.  Decreased hearing.  Tiredness (lethargy).  Fluid leaking from the ear, if the eardrum is ruptured or has burst.  Ringing in the ear. How is this diagnosed? This condition is diagnosed with a physical exam. During the exam, your health care provider will use an instrument called an otoscope to look in your ear and check for redness, swelling, and fluid. He or she will also ask about your symptoms. Your health care provider may also order tests, such as:  A pneumatic otoscopy. This is a test to check the movement of the eardrum. It is done by squeezing a small amount of air into the ear.  A tympanogram is a test that shows how well  the eardrum moves in response to air pressure in the ear canal. It provides a graph for your health care provider to review.   How is this treated? This condition can go away on its own within 3-5 days. But if the condition is caused by a bacterial infection and does not go away on its own, or if it keeps coming back, your health care provider may:  Prescribe antibiotic medicine to treat the infection.  Prescribe or recommend medicines to control pain. Follow these instructions at home:  Take over-the-counter and prescription medicines only as told by your health care provider.  If you were prescribed an antibiotic medicine, take it as told by your health care provider. Do not stop taking the antibiotic even if you start to feel better.  Keep all follow-up visits as told by your health care provider. This is important. Contact a health care provider if:  You have bleeding from your nose.  There is a lump on your neck.  You are not feeling better in 5 days.  You feel worse instead of better. Get help right away if:  You have severe pain that is not controlled with medicine.  You have swelling, redness, or pain around your ear.  You have stiffness in your neck.  A part of your face is not moving (paralyzed).  The bone behind your ear (mastoid) is tender when you touch it.  You develop a severe headache. Summary    Otitis media is redness, soreness, and swelling of the middle ear, usually resulting in pain.  This condition can go away on its own within 3-5 days.  If the problem does not go away in 3-5 days, your health care provider may prescribe or recommend medicines to treat the infection or your symptoms.  If you were prescribed an antibiotic medicine, take it as told by your health care provider.  Follow all instructions you were given by your health care provider. This information is not intended to replace advice given to you by your health care provider. Make sure  you discuss any questions you have with your health care provider. Document Revised: 08/09/2019 Document Reviewed: 08/09/2019 Elsevier Patient Education  2021 Elsevier Inc.  

## 2021-02-17 NOTE — Assessment & Plan Note (Signed)
abx per orders pred taper  

## 2021-02-18 NOTE — Telephone Encounter (Signed)
Confirmed with Pharmacy that this was sent over- pt has picked this up already.

## 2021-02-23 ENCOUNTER — Telehealth: Payer: Self-pay | Admitting: Family Medicine

## 2021-02-23 NOTE — Telephone Encounter (Signed)
Patient states she was told my [place] that is her ear was not better by Last Friday she will need to call back to possibly switch meds. Patient states her ears are still clogged and needs to know what further action to take

## 2021-02-23 NOTE — Telephone Encounter (Signed)
D/c augmentin  Z pak as directed  F/u [place] if no better

## 2021-02-24 ENCOUNTER — Other Ambulatory Visit: Payer: Self-pay

## 2021-02-24 DIAGNOSIS — H6503 Acute serous otitis media, bilateral: Secondary | ICD-10-CM

## 2021-02-24 MED ORDER — AZITHROMYCIN 250 MG PO TABS: ORAL_TABLET | ORAL | 0 refills | Status: AC

## 2021-02-24 NOTE — Telephone Encounter (Signed)
Pt is aware and voices understanding.  

## 2021-03-04 ENCOUNTER — Telehealth: Payer: Self-pay | Admitting: Family Medicine

## 2021-03-04 DIAGNOSIS — J302 Other seasonal allergic rhinitis: Secondary | ICD-10-CM

## 2021-03-04 DIAGNOSIS — J4530 Mild persistent asthma, uncomplicated: Secondary | ICD-10-CM

## 2021-03-04 NOTE — Telephone Encounter (Signed)
Pt states Pharmacy informed her that they sent request in twice for her rx inhaler, Pt would like to confirm if we have the rx and if not can we please send it to her pharmacy. [address]

## 2021-03-05 MED ORDER — BUDESONIDE-FORMOTEROL FUMARATE 80-4.5 MCG/ACT IN AERO: 2.0000 | INHALATION_SPRAY | Freq: Two times a day (BID) | RESPIRATORY_TRACT | 3 refills | Status: DC

## 2021-03-05 NOTE — Telephone Encounter (Signed)
Patient's inhaler sent to pharmacy per her request

## 2021-03-10 ENCOUNTER — Other Ambulatory Visit: Payer: Self-pay | Admitting: Orthopaedic Surgery

## 2021-03-10 NOTE — Telephone Encounter (Signed)
Please advise 

## 2021-08-06 ENCOUNTER — Telehealth: Payer: Self-pay | Admitting: Family Medicine

## 2021-08-06 NOTE — Telephone Encounter (Signed)
Pt spouse would like medication switched to [facility].  [address] [place], [place] [address] Phone: [phone]  Medication:  albuterol (VENTOLIN HFA) 108 (90 Base) MCG/ACT inhaler [[id number]]  montelukast (SINGULAIR) 10 MG tablet [[id number]] azelastine (ASTELIN) 0.1 % nasal spray [[id number]]     Has the patient contacted their pharmacy? No. (If no, request that the patient contact the pharmacy for the refill.) (If yes, when and what did the pharmacy advise?)     Preferred Pharmacy (with phone number or street name):  [facility].  [address], [place], [place] [address] Phone: [phone]  [email or web address] // please send information here once completed. If any new questions please contact tele: [phone]  Agent: Please be advised that RX refills may take up to 3 business days. We ask that you follow-up with your pharmacy.

## 2021-08-07 ENCOUNTER — Other Ambulatory Visit: Payer: Self-pay

## 2021-08-07 DIAGNOSIS — J4530 Mild persistent asthma, uncomplicated: Secondary | ICD-10-CM

## 2021-08-07 DIAGNOSIS — J3089 Other allergic rhinitis: Secondary | ICD-10-CM

## 2021-08-07 DIAGNOSIS — J302 Other seasonal allergic rhinitis: Secondary | ICD-10-CM

## 2021-08-07 NOTE — Telephone Encounter (Signed)
Unable to find this pharmacy in Epic- Name was found with a different address.

## 2021-08-11 NOTE — Telephone Encounter (Signed)
Error

## 2021-08-11 NOTE — Telephone Encounter (Signed)
Pt's husband called pharmacy and confirmed it is [facility] (address reconfirmed) and pharmacy stated a lot of dr's are having trouble finding address in the system and stated we can send it to them via fax, fax #: [phone]

## 2021-08-12 ENCOUNTER — Other Ambulatory Visit: Payer: Self-pay

## 2021-08-12 DIAGNOSIS — J4530 Mild persistent asthma, uncomplicated: Secondary | ICD-10-CM

## 2021-08-12 DIAGNOSIS — J3089 Other allergic rhinitis: Secondary | ICD-10-CM

## 2021-08-12 DIAGNOSIS — J302 Other seasonal allergic rhinitis: Secondary | ICD-10-CM

## 2021-08-12 MED ORDER — ALBUTEROL SULFATE HFA 108 (90 BASE) MCG/ACT IN AERS: INHALATION_SPRAY | RESPIRATORY_TRACT | 5 refills | Status: DC

## 2021-08-12 MED ORDER — MONTELUKAST SODIUM 10 MG PO TABS: 10.0000 mg | ORAL_TABLET | Freq: Every day | ORAL | 3 refills | Status: DC

## 2021-08-12 MED ORDER — AZELASTINE HCL 0.1 % NA SOLN: 1.0000 | Freq: Two times a day (BID) | NASAL | 3 refills | Status: DC

## 2021-08-12 NOTE — Telephone Encounter (Signed)
Rx sent 

## 2021-08-28 ENCOUNTER — Telehealth: Payer: Self-pay

## 2021-08-28 ENCOUNTER — Other Ambulatory Visit: Payer: Self-pay

## 2021-08-28 DIAGNOSIS — J4530 Mild persistent asthma, uncomplicated: Secondary | ICD-10-CM

## 2021-08-28 NOTE — Telephone Encounter (Signed)
Called pharmacy, there was an issue with the dispense quantity. This has been corrected. They will notify the pt.

## 2021-08-28 NOTE — Telephone Encounter (Signed)
Pt's Spouse called stating when the three meds were sent in to [facility] On 11/23 all but one (albuterol) was received by them.  Please resend this as soon as possible for the pt.  Fax number is [phone].

## 2021-09-01 ENCOUNTER — Telehealth (INDEPENDENT_AMBULATORY_CARE_PROVIDER_SITE_OTHER): Payer: BLUE CROSS/BLUE SHIELD | Admitting: Family Medicine

## 2021-09-01 DIAGNOSIS — U071 COVID-19: Secondary | ICD-10-CM

## 2021-09-01 MED ORDER — BENZONATATE 100 MG PO CAPS: ORAL_CAPSULE | ORAL | 0 refills | Status: DC

## 2021-09-01 MED ORDER — MOLNUPIRAVIR EUA 200MG CAPSULE: 4.0000 | ORAL_CAPSULE | Freq: Two times a day (BID) | ORAL | 0 refills | Status: AC

## 2021-09-01 NOTE — Progress Notes (Signed)
Virtual Visit via Video Note  I connected withNAME@  on 09/01/21 at  6:00 PM EST by a video enabled telemedicine application and verified that I am speaking with the correct person using two identifiers.  Location patient: home, [place] Location provider:work or home office Persons participating in the virtual visit: patient, provider  I discussed the limitations of evaluation and management by telemedicine and the availability of in person appointments. The patient expressed understanding and agreed to proceed.   HPI:  Acute telemedicine visit for COVID19: -Onset: about 3 days ago, tested positive for covid yesterday -Symptoms include: fevers, body aches, feels tired, some congestion, some diarrhea -Denies: CP, SOB, vomiting -drinking and eating ok -Pertinent past medical history: asthma on symbicort, has used alb a little since sick -Pertinent medication allergies: No Known Allergies -COVID-19 vaccine status: Immunization History  Administered Date(s) Administered   PFIZER(Purple Top)SARS-COV-2 Vaccination 12/06/2019, 12/29/2019   Tdap 12/22/2015   ROS: See pertinent positives and negatives per HPI.  Past Medical History:  Diagnosis Date   Allergy    Asthma    Environmental allergies    Plantar fasciitis    Bilateral   Seasonal allergies     Past Surgical History:  Procedure Laterality Date   CESAREAN SECTION     FOOT SURGERY     Right   TONSILLECTOMY       Current Outpatient Medications:    benzonatate (TESSALON PERLES) 100 MG capsule, 1-2 capsules up to twice daily as needed for cough, Disp: 40 capsule, Rfl: 0   molnupiravir EUA (LAGEVRIO) 200 mg CAPS capsule, Take 4 capsules (800 mg total) by mouth 2 (two) times daily for 5 days., Disp: 40 capsule, Rfl: 0   albuterol (VENTOLIN HFA) 108 (90 Base) MCG/ACT inhaler, INHALE 2 PUFFS BY MOUTH EVERY 6 HOURS AS NEEDED FOR COUGH OR WHEEZING, Disp: 18 g, Rfl: 5   Albuterol Sulfate 2.5 MG/0.5ML NEBU, Inhale 0.5 mLs into the  lungs 4 (four) times daily as needed., Disp: 20 mL, Rfl: 1   azelastine (ASTELIN) 0.1 % nasal spray, Place 1 spray into both nostrils 2 (two) times daily. Use in each nostril as directed, Disp: 90 mL, Rfl: 3   budesonide-formoterol (SYMBICORT) 80-4.5 MCG/ACT inhaler, Inhale 2 puffs into the lungs 2 (two) times daily., Disp: 3 each, Rfl: 3   cetirizine (ZYRTEC) 10 MG tablet, Take 1 tablet (10 mg total) by mouth daily as needed for allergies or rhinitis., Disp: 90 tablet, Rfl: 3   fluticasone (FLONASE) 50 MCG/ACT nasal spray, INSTILL 1 TO 2 SPRAYS INTO EACH NOSTRIL EVERY DAY, Disp: 48 mL, Rfl: 11   montelukast (SINGULAIR) 10 MG tablet, TAKE 1 TABLET(10 MG) BY MOUTH AT BEDTIME, Disp: 30 tablet, Rfl: 0   montelukast (SINGULAIR) 10 MG tablet, Take 1 tablet (10 mg total) by mouth at bedtime., Disp: 90 tablet, Rfl: 3   naproxen (NAPROSYN) 500 MG tablet, TAKE 1 TABLET(500 MG) BY MOUTH TWICE DAILY WITH A MEAL, Disp: 60 tablet, Rfl: 3   predniSONE (DELTASONE) 10 MG tablet, TAKE 3 TABLETS PO QD FOR 3 DAYS THEN TAKE 2 TABLETS PO QD FOR 3 DAYS THEN TAKE 1 TABLET PO QD FOR 3 DAYS THEN TAKE 1/2 TAB PO QD FOR 3 DAYS, Disp: 20 tablet, Rfl: 0   traMADol (ULTRAM) 50 MG tablet, TAKE 1 TABLET BY MOUTH TWICE DAILY AS NEEDED FOR PAIN, Disp: 30 tablet, Rfl: 0  EXAM:  VITALS per patient if applicable:  GENERAL: alert, oriented, appears well and in no acute distress  HEENT: atraumatic, conjunttiva clear, no obvious abnormalities on inspection of external nose and ears  NECK: normal movements of the head and neck  LUNGS: on inspection no signs of respiratory distress, breathing rate appears normal, no obvious gross SOB, gasping or wheezing  CV: no obvious cyanosis  MS: moves all visible extremities without noticeable abnormality  PSYCH/NEURO: pleasant and cooperative, no obvious depression or anxiety, speech and thought processing grossly intact  ASSESSMENT AND PLAN:  Discussed the following assessment and  plan:  COVID-19   Discussed treatment options and risk of drug interactions, ideal treatment window, potential complications, isolation and precautions for COVID-19.  Discussed possibility of rebound with antivirals and the need to reisolate if it should occur for 5 days. Checked for/reviewed any labs done in the last 90 days with GFR listed in HPI if available. After lengthy discussion, the patient opted for treatment with Legevrio due to being higher risk for complications of covid or severe disease and other factors. Discussed EUA status of this drug and the fact that there is preliminary limited knowledge of risks/interactions/side effects per EUA document vs possible benefits and precautions. This information was shared with patient during the visit and also was provided in patient instructions. Also, advised that patient discuss risks/interactions and use with pharmacist/treatment team as well.  The patient did want a prescription for cough, Tessalon Rx sent.  Other symptomatic care measures summarized in patient instructions. Advised to seek prompt in person care if worsening, new symptoms arise, or if is not improving with treatment.    I discussed the assessment and treatment plan with the patient. The patient was provided an opportunity to ask questions and all were answered. The patient agreed with the plan and demonstrated an understanding of the instructions.     Terressa Koyanagi, DO

## 2021-09-01 NOTE — Patient Instructions (Addendum)
HOME CARE TIPS:   -I sent the medication(s) we discussed to your pharmacy: Meds ordered this encounter  Medications   molnupiravir EUA (LAGEVRIO) 200 mg CAPS capsule    Sig: Take 4 capsules (800 mg total) by mouth 2 (two) times daily for 5 days.    Dispense:  40 capsule    Refill:  0   benzonatate (TESSALON PERLES) 100 MG capsule    Sig: 1-2 capsules up to twice daily as needed for cough    Dispense:  40 capsule    Refill:  0     -I sent in the [facility] treatment or referral you requested per our discussion. Please see the information provided below and discuss further with the pharmacist/treatment team.   -there is a chance of rebound illness after finishing your treatment. If you become sick again please isolate for an additional 5 days, plus 5 more days of masking.   -can use tylenol or aleve if needed for fevers, aches and pains per instructions  -can use nasal saline a few times per day if you have nasal congestion; sometimes  a short course of Afrin nasal spray for 3 days can help with symptoms as well  -stay hydrated, drink plenty of fluids and eat small healthy meals - avoid dairy  -follow up with your doctor in 2-3 days unless improving and feeling better  -stay home while sick, except to seek medical care. If you have COVID19, you will likely be contagious for 7-10 days. Flu or Influenza is likely contagious for about 7 days. Other respiratory viral infections remain contagious for 5-10+ days depending on the virus and many other factors. Wear a good mask that fits snugly (such as N95 or KN95) if around others to reduce the risk of transmission.  It was nice to meet you today, and I really hope you are feeling better soon. I help [facility] out with telemedicine visits on Tuesdays and Thursdays and am happy to help if you need a follow up virtual visit on those days. Otherwise, if you have any concerns or questions following this visit please schedule a follow up visit with  your Primary Care doctor or seek care at a local urgent care clinic to avoid delays in care.    Seek in person care or schedule a follow up video visit promptly if your symptoms worsen, new concerns arise or you are not improving with treatment. Call 911 and/or seek emergency care if your symptoms are severe or life threatening.    Fact Sheet for Patients And Caregivers Emergency Use Authorization (EUA) Of LAGEVRIO (molnupiravir) capsules For Coronavirus Disease 2019 (COVID-19)  What is the most important information I should know about LAGEVRIO? LAGEVRIO may cause serious side effects, including: ? LAGEVRIO may cause harm to your unborn baby. It is not known if LAGEVRIO will harm your baby if you take LAGEVRIO during pregnancy. o LAGEVRIO is not recommended for use in pregnancy. o LAGEVRIO has not been studied in pregnancy. LAGEVRIO was studied in pregnant animals only. When LAGEVRIO was given to pregnant animals, LAGEVRIO caused harm to their unborn babies. o You and your healthcare provider may decide that you should take LAGEVRIO during pregnancy if there are no other COVID-19 treatment options approved or authorized by the FDA that are accessible or clinically appropriate for you. o If you and your healthcare provider decide that you should take LAGEVRIO during pregnancy, you and your healthcare provider should discuss the known and potential benefits and the potential  risks of taking LAGEVRIO during pregnancy. For individuals who are able to become pregnant: ? You should use a reliable method of birth control (contraception) consistently and correctly during treatment with LAGEVRIO and for 4 days after the last dose of LAGEVRIO. Talk to your healthcare provider about reliable birth control methods. ? Before starting treatment with [facility] your healthcare provider may do a pregnancy test to see if you are pregnant before starting treatment with LAGEVRIO. ? Tell your healthcare  provider right away if you become pregnant or think you may be pregnant during treatment with LAGEVRIO. Pregnancy Surveillance Program: ? There is a pregnancy surveillance program for individuals who take LAGEVRIO during pregnancy. The purpose of this program is to collect information about the health of you and your baby. Talk to your healthcare provider about how to take part in this program. ? If you take LAGEVRIO during pregnancy and you agree to participate in the pregnancy surveillance program and allow your healthcare provider to share your information with [place], then your healthcare provider will report your use of [place] during pregnancy to [place]. by calling [phone] or PeacefulBlog.es. For individuals who are sexually active with partners who are able to become pregnant: ? It is not known if LAGEVRIO can affect sperm. While the risk is regarded as low, animal studies to fully assess the potential for LAGEVRIO to affect the babies of males treated with LAGEVRIO have not been completed. A reliable method of birth control (contraception) should be used consistently and correctly during treatment with LAGEVRIO and for at least 3 months after the last dose. The risk to sperm beyond 3 months is not known. Studies to understand the risk to sperm beyond 3 months are ongoing. Talk to your healthcare provider about reliable birth control methods. Talk to your healthcare provider if you have questions or concerns about how LAGEVRIO may affect sperm. You are being given this fact sheet because your healthcare provider believes it is necessary to provide you with LAGEVRIO for the treatment of adults with mild-to-moderate coronavirus disease 2019 (COVID-19) with positive results of direct SARS-CoV-2 viral testing, and who are at high risk for progression to severe COVID-19 including hospitalization or death, and for whom other COVID-19  treatment options approved or authorized by the FDA are not accessible or clinically appropriate. The U.S. Food and Drug Administration (FDA) has issued an Emergency Use Authorization (EUA) to make LAGEVRIO available during the COVID-19 pandemic (for more details about an EUA please see What is an Emergency Use Authorization? at the end of this document). LAGEVRIO is not an FDA-approved medicine in the [place]. Read this Fact Sheet for information about LAGEVRIO. Talk to your healthcare provider about your options if you have any questions. It is your choice to take LAGEVRIO.  What is COVID-19? COVID-19 is caused by a virus called a coronavirus. You can get COVID-19 through close contact with another person who has the virus. COVID-19 illnesses have ranged from very mild-to-severe, including illness resulting in death. While information so far suggests that most COVID-19 illness is mild, serious illness can happen and may cause some of your other medical conditions to become worse. Older people and people of all ages with severe, long lasting (chronic) medical conditions like heart disease, lung disease and diabetes, for example seem to be at higher risk of being hospitalized for COVID-19.  What is LAGEVRIO? LAGEVRIO is an investigational medicine used to treat mild-to-moderate COVID-19 in adults: ?  with positive results of direct SARS-CoV-2 viral testing, and ? who are at high risk for progression to severe COVID-19 including hospitalization or death, and for whom other COVID-19 treatment options approved or authorized by the FDA are not accessible or clinically appropriate. The FDA has authorized the emergency use of LAGEVRIO for the treatment of mild-tomoderate COVID-19 in adults under an EUA. For more information on EUA, see the What is an Emergency Use Authorization (EUA)? section at the end of this Fact Sheet. LAGEVRIO is not authorized: ? for use in people less than 32  years of age. ? for prevention of COVID-19. ? for people needing hospitalization for COVID-19. ? for use for longer than 5 consecutive days.  What should I tell my healthcare provider before I take LAGEVRIO? Tell your healthcare provider if you: ? Have any allergies ? Are breastfeeding or plan to breastfeed ? Have any serious illnesses ? Are taking any medicines (prescription, over-the-counter, vitamins, or herbal products).  How do I take LAGEVRIO? ? Take LAGEVRIO exactly as your healthcare provider tells you to take it. ? Take 4 capsules of LAGEVRIO every 12 hours (for example, at 8 am and at 8 pm) ? Take LAGEVRIO for 5 days. It is important that you complete the full 5 days of treatment with LAGEVRIO. Do not stop taking LAGEVRIO before you complete the full 5 days of treatment, even if you feel better. ? Take LAGEVRIO with or without food. ? You should stay in isolation for as long as your healthcare provider tells you to. Talk to your healthcare provider if you are not sure about how to properly isolate while you have COVID-19. ? Swallow LAGEVRIO capsules whole. Do not open, break, or crush the capsules. If you cannot swallow capsules whole, tell your healthcare provider. ? What to do if you miss a dose: o If it has been less than 10 hours since the missed dose, take it as soon as you remember o If it has been more than 10 hours since the missed dose, skip the missed dose and take your dose at the next scheduled time. ? Do not double the dose of LAGEVRIO to make up for a missed dose.  What are the important possible side effects of LAGEVRIO? ? See, What is the most important information I should know about LAGEVRIO? ? Allergic Reactions. Allergic reactions can happen in people taking LAGEVRIO, even after only 1 dose. Stop taking LAGEVRIO and call your healthcare provider right away if you get any of the following symptoms of an allergic reaction: o hives o rapid  heartbeat o trouble swallowing or breathing o swelling of the mouth, lips, or face o throat tightness o hoarseness o skin rash The most common side effects of LAGEVRIO are: ? diarrhea ? nausea ? dizziness These are not all the possible side effects of LAGEVRIO. Not many people have taken LAGEVRIO. Serious and unexpected side effects may happen. This medicine is still being studied, so it is possible that all of the risks are not known at this time.  What other treatment choices are there?  Veklury (remdesivir) is FDA-approved as an intravenous (IV) infusion for the treatment of mildto-moderate BLTJQ-30 in certain adults and children. Talk with your doctor to see if Marijean Heath is appropriate for you. Like LAGEVRIO, FDA may also allow for the emergency use of other medicines to treat people with COVID-19. Go to LacrosseProperties.si for more information. It is your choice to be treated or not to be treated  with LAGEVRIO. Should you decide not to take it, it will not change your standard medical care.  What if I am breastfeeding? Breastfeeding is not recommended during treatment with LAGEVRIO and for 4 days after the last dose of LAGEVRIO. If you are breastfeeding or plan to breastfeed, talk to your healthcare provider about your options and specific situation before taking LAGEVRIO.  How do I report side effects with LAGEVRIO? Contact your healthcare provider if you have any side effects that bother you or do not go away. Report side effects to FDA MedWatch at SmoothHits.hu or call 1-800-FDA-1088 (1- 431-510-2085).  How should I store [place]? ? Store LAGEVRIO capsules at room temperature between 63F to 74F (20C to 25C). ? Keep LAGEVRIO and all medicines out of the reach of children and pets. How can I learn more about COVID-19? ? Ask your healthcare provider. ? Visit  SeekRooms.co.uk ? Contact your local or state public health department. ? Call [place] at [phone] (toll free in the [place] ? Visit www.molnupiravir.com  What Is an Emergency Use Authorization (EUA)? The [place] FDA has made Levan available under an emergency access mechanism called an Emergency Use Authorization (EUA) The EUA is supported by a Presenter, broadcasting Health and Human Service (HHS) declaration that circumstances exist to justify emergency use of drugs and biological products during the COVID-19 pandemic. LAGEVRIO for the treatment of mild-to-moderate COVID-19 in adults with positive results of direct SARS-CoV-2 viral testing, who are at high risk for progression to severe COVID-19, including hospitalization or death, and for whom alternative COVID-19 treatment options approved or authorized by FDA are not accessible or clinically appropriate, has not undergone the same type of review as an FDA-approved product. In issuing an EUA under the XBWIO-03 public health emergency, the FDA has determined, among other things, that based on the total amount of scientific evidence available including data from adequate and well-controlled clinical trials, if available, it is reasonable to believe that the product may be effective for diagnosing, treating, or preventing COVID-19, or a serious or life-threatening disease or condition caused by COVID-19; that the known and potential benefits of the product, when used to diagnose, treat, or prevent such disease or condition, outweigh the known and potential risks of such product; and that there are no adequate, approved, and available alternatives.  All of these criteria must be met to allow for the product to be used in the treatment of patients during the COVID-19 pandemic. The EUA for LAGEVRIO is in effect for the duration of the COVID-19 declaration justifying emergency use of LAGEVRIO, unless terminated or  revoked (after which LAGEVRIO may no longer be used under the EUA). For patent information: http://rogers.info/ Copyright  2021-2022 [place] [place], [place] and its affiliates. All rights reserved. [id number] Revised: March 2022

## 2021-10-09 ENCOUNTER — Other Ambulatory Visit: Payer: Self-pay

## 2021-10-09 DIAGNOSIS — J4530 Mild persistent asthma, uncomplicated: Secondary | ICD-10-CM

## 2021-10-09 MED ORDER — ALBUTEROL SULFATE HFA 108 (90 BASE) MCG/ACT IN AERS: INHALATION_SPRAY | RESPIRATORY_TRACT | 5 refills | Status: DC

## 2021-10-13 ENCOUNTER — Other Ambulatory Visit: Payer: Self-pay

## 2021-10-13 DIAGNOSIS — J3089 Other allergic rhinitis: Secondary | ICD-10-CM

## 2021-10-13 MED ORDER — AZELASTINE HCL 0.1 % NA SOLN: 1.0000 | Freq: Two times a day (BID) | NASAL | 3 refills | Status: DC

## 2021-11-22 ENCOUNTER — Other Ambulatory Visit: Payer: Self-pay | Admitting: Family Medicine

## 2021-11-22 DIAGNOSIS — J302 Other seasonal allergic rhinitis: Secondary | ICD-10-CM

## 2021-11-22 DIAGNOSIS — J4530 Mild persistent asthma, uncomplicated: Secondary | ICD-10-CM

## 2021-12-11 ENCOUNTER — Other Ambulatory Visit: Payer: Self-pay | Admitting: Family Medicine

## 2021-12-11 DIAGNOSIS — J302 Other seasonal allergic rhinitis: Secondary | ICD-10-CM

## 2021-12-16 ENCOUNTER — Other Ambulatory Visit: Payer: Self-pay

## 2021-12-16 DIAGNOSIS — J3089 Other allergic rhinitis: Secondary | ICD-10-CM

## 2021-12-16 MED ORDER — FLUTICASONE PROPIONATE 50 MCG/ACT NA SUSP: NASAL | 11 refills | Status: DC

## 2021-12-17 ENCOUNTER — Ambulatory Visit (INDEPENDENT_AMBULATORY_CARE_PROVIDER_SITE_OTHER): Payer: 59 | Admitting: Family

## 2021-12-17 VITALS — BP 128/80 | HR 74 | Temp 98.1°F | Resp 16 | Ht 60.0 in | Wt 136.2 lb

## 2021-12-17 DIAGNOSIS — J019 Acute sinusitis, unspecified: Secondary | ICD-10-CM

## 2021-12-17 MED ORDER — PREDNISONE 20 MG PO TABS: ORAL_TABLET | ORAL | 0 refills | Status: DC

## 2021-12-17 MED ORDER — AZITHROMYCIN 250 MG PO TABS: ORAL_TABLET | ORAL | 0 refills | Status: DC

## 2021-12-17 NOTE — Progress Notes (Signed)
?Jane Spencer is a 56 y.o. female with the following history as recorded in EpicCare:  ?Patient Active Problem List  ? Diagnosis Date Noted  ? Non-recurrent acute serous otitis media of both ears 02/17/2021  ? Bronchitis 02/17/2021  ? Tibial plateau fracture, unspecified laterality, closed, initial encounter 07/02/2020  ? Acute lateral meniscal tear 06/18/2020  ? Nasal polyp - anterior 01/02/2016  ? Tobacco use disorder 12/22/2015  ? Asthma, mild persistent 02/26/2015  ?  ?Current Outpatient Medications  ?Medication Sig Dispense Refill  ? albuterol (VENTOLIN HFA) 108 (90 Base) MCG/ACT inhaler INHALE 2 PUFFS BY MOUTH EVERY 6 HOURS AS NEEDED FOR COUGH OR WHEEZING 18 g 5  ? Albuterol Sulfate 2.5 MG/0.5ML NEBU Inhale 0.5 mLs into the lungs 4 (four) times daily as needed. 20 mL 1  ? azelastine (ASTELIN) 0.1 % nasal spray Place 1 spray into both nostrils 2 (two) times daily. Use in each nostril as directed 90 mL 3  ? azithromycin (ZITHROMAX) 250 MG tablet 2 tabs po qd x 1 day; 1 tablet per day x 4 days; 6 tablet 0  ? benzonatate (TESSALON PERLES) 100 MG capsule 1-2 capsules up to twice daily as needed for cough 40 capsule 0  ? budesonide-formoterol (SYMBICORT) 80-4.5 MCG/ACT inhaler Inhale 2 puffs into the lungs 2 (two) times daily. 3 each 3  ? cetirizine (ZYRTEC) 10 MG tablet TAKE 1 TABLET(10 MG) BY MOUTH DAILY AS NEEDED FOR ALLERGIES OR RHINITIS 90 tablet 0  ? fluticasone (FLONASE) 50 MCG/ACT nasal spray INSTILL 1 TO 2 SPRAYS INTO EACH NOSTRIL EVERY DAY 48 mL 11  ? montelukast (SINGULAIR) 10 MG tablet TAKE 1 TABLET(10 MG) BY MOUTH AT BEDTIME 30 tablet 0  ? montelukast (SINGULAIR) 10 MG tablet Take 1 tablet (10 mg total) by mouth at bedtime. 90 tablet 3  ? montelukast (SINGULAIR) 10 MG tablet TAKE 1 TABLET(10 MG) BY MOUTH AT BEDTIME 90 tablet 3  ? naproxen (NAPROSYN) 500 MG tablet TAKE 1 TABLET(500 MG) BY MOUTH TWICE DAILY WITH A MEAL 60 tablet 3  ? predniSONE (DELTASONE) 20 MG tablet Take 2 tablets daily x 2 days, then  1 per day x 5 days; 9 tablet 0  ? traMADol (ULTRAM) 50 MG tablet TAKE 1 TABLET BY MOUTH TWICE DAILY AS NEEDED FOR PAIN 30 tablet 0  ? ?No current facility-administered medications for this visit.  ?  ?Allergies: Patient has no known allergies.  ?Past Medical History:  ?Diagnosis Date  ? Allergy   ? Asthma   ? Environmental allergies   ? Plantar fasciitis   ? Bilateral  ? Seasonal allergies   ?  ?Past Surgical History:  ?Procedure Laterality Date  ? CESAREAN SECTION    ? FOOT SURGERY    ? Right  ? TONSILLECTOMY    ?  ?Family History  ?Problem Relation Age of Onset  ? Macular degeneration Mother   ?     Living  ? Vascular Disease Mother   ? Stroke Father 32  ?     Deceased  ? Other Father   ?     Borderline Diabetes  ? Lung cancer Maternal Uncle   ?     #1  ? Heart attack Maternal Uncle   ?     Deceased at 39, #2  ? Skin cancer Maternal Uncle   ?     #1  ? HIV/AIDS Brother   ?     #1  ? Healthy Brother   ?     #  2  ? Healthy Sister   ?     x2  ? Heart defect Son   ?     Bicuspid Valve  ? Healthy Son   ?     #2  ? Healthy Daughter   ?     x1  ? Allergies Son   ?     #1  ?  ?Social History  ? ?Tobacco Use  ? Smoking status: Some Days  ?  Packs/day: 0.10  ?  Types: Cigarettes  ? Smokeless tobacco: Never  ? Tobacco comments:  ?  1 cigarette per day  ?Substance Use Topics  ? Alcohol use: Yes  ?  Alcohol/week: 8.0 standard drinks  ?  Types: 8 Standard drinks or equivalent per week  ?  ?Subjective:  ? ?Patient presents with concerns for possible sinus infection; notes symptoms present x 1-2 weeks; +facial pain/ pressure; ears full; does have underlying asthma but no lower respiratory symptoms; no fever; using allergy medication as prescribed; ?Requesting Z-pak which she has taken with success in the past;  ? ? ?Objective:  ?Vitals:  ? 12/17/21 1324  ?BP: 128/80  ?Pulse: 74  ?Resp: 16  ?Temp: 98.1 ?F (36.7 ?C)  ?TempSrc: Oral  ?SpO2: 92%  ?Weight: 136 lb 3.2 oz (61.8 kg)  ?Height: 5' (1.524 m)  ?  ?General: Well developed,  well nourished, in no acute distress  ?Skin : Warm and dry.  ?Head: Normocephalic and atraumatic  ?Eyes: Sclera and conjunctiva clear; pupils round and reactive to light; extraocular movements intact  ?Ears: External normal; canals clear; tympanic membranes congested;  ?Oropharynx: Pink, supple. No suspicious lesions  ?Neck: Supple without thyromegaly, adenopathy  ?Lungs: Respirations unlabored; clear to auscultation bilaterally without wheeze, rales, rhonchi  ?CVS exam: normal rate and regular rhythm.  ?Neurologic: Alert and oriented; speech intact; face symmetrical; moves all extremities well; CNII-XII intact without focal deficit  ? ?Assessment:  ?1. Acute sinusitis, recurrence not specified, unspecified location   ?  ?Plan:  ?Rx for Z-pak and prednisone; increase fluids, rest and follow up worse, no better; ? ?This visit occurred during the SARS-CoV-2 public health emergency.  Safety protocols were in place, including screening questions prior to the visit, additional usage of staff PPE, and extensive cleaning of exam room while observing appropriate contact time as indicated for disinfecting solutions.  ? ? ?No follow-ups on file.  ?No orders of the defined types were placed in this encounter. ?  ?Requested Prescriptions  ? ?Signed Prescriptions Disp Refills  ? azithromycin (ZITHROMAX) 250 MG tablet 6 tablet 0  ?  Sig: 2 tabs po qd x 1 day; 1 tablet per day x 4 days;  ? predniSONE (DELTASONE) 20 MG tablet 9 tablet 0  ?  Sig: Take 2 tablets daily x 2 days, then 1 per day x 5 days;  ?  ? ?

## 2022-03-22 ENCOUNTER — Other Ambulatory Visit: Payer: Self-pay | Admitting: Family Medicine

## 2022-03-22 DIAGNOSIS — J302 Other seasonal allergic rhinitis: Secondary | ICD-10-CM

## 2022-03-24 ENCOUNTER — Other Ambulatory Visit: Payer: Self-pay | Admitting: Family Medicine

## 2022-03-24 DIAGNOSIS — J4530 Mild persistent asthma, uncomplicated: Secondary | ICD-10-CM

## 2022-06-25 ENCOUNTER — Other Ambulatory Visit: Payer: Self-pay | Admitting: Family Medicine

## 2022-06-25 DIAGNOSIS — J302 Other seasonal allergic rhinitis: Secondary | ICD-10-CM

## 2022-08-02 ENCOUNTER — Other Ambulatory Visit: Payer: Self-pay | Admitting: Family Medicine

## 2022-08-02 DIAGNOSIS — J302 Other seasonal allergic rhinitis: Secondary | ICD-10-CM

## 2022-08-04 ENCOUNTER — Other Ambulatory Visit: Payer: Self-pay | Admitting: Family Medicine

## 2022-08-04 DIAGNOSIS — J302 Other seasonal allergic rhinitis: Secondary | ICD-10-CM

## 2022-08-31 ENCOUNTER — Other Ambulatory Visit: Payer: Self-pay | Admitting: Family Medicine

## 2022-08-31 DIAGNOSIS — J302 Other seasonal allergic rhinitis: Secondary | ICD-10-CM

## 2022-08-31 DIAGNOSIS — J4530 Mild persistent asthma, uncomplicated: Secondary | ICD-10-CM

## 2022-11-25 ENCOUNTER — Other Ambulatory Visit: Payer: Self-pay | Admitting: Family Medicine

## 2022-11-25 DIAGNOSIS — J4530 Mild persistent asthma, uncomplicated: Secondary | ICD-10-CM

## 2022-11-26 ENCOUNTER — Ambulatory Visit: Payer: BLUE CROSS/BLUE SHIELD | Admitting: Orthopaedic Surgery

## 2022-12-19 ENCOUNTER — Other Ambulatory Visit: Payer: Self-pay | Admitting: Family Medicine

## 2022-12-19 DIAGNOSIS — J3089 Other allergic rhinitis: Secondary | ICD-10-CM

## 2022-12-21 ENCOUNTER — Other Ambulatory Visit: Payer: Self-pay | Admitting: Family Medicine

## 2022-12-21 DIAGNOSIS — J4530 Mild persistent asthma, uncomplicated: Secondary | ICD-10-CM

## 2023-01-18 ENCOUNTER — Other Ambulatory Visit: Payer: Self-pay | Admitting: Family Medicine

## 2023-01-18 DIAGNOSIS — J4530 Mild persistent asthma, uncomplicated: Secondary | ICD-10-CM

## 2023-02-06 ENCOUNTER — Other Ambulatory Visit: Payer: Self-pay | Admitting: Family Medicine

## 2023-02-06 DIAGNOSIS — J4530 Mild persistent asthma, uncomplicated: Secondary | ICD-10-CM

## 2023-02-06 DIAGNOSIS — J302 Other seasonal allergic rhinitis: Secondary | ICD-10-CM

## 2023-02-14 NOTE — Progress Notes (Signed)
Healthcare at [facility] [address], [address] [place], [place] [address] [phone]  Date:  02/16/2023   Name:  Jane Spencer   DOB:  1966-04-21   MRN:  [id number]  PCP:  Pearline Cables, MD    Chief Complaint: Annual Exam (Concerns/ questions: none/Colon: none in the past/Shingrix: none yet/Mammogram, pap: due)   History of Present Illness:  Jane Spencer is a 57 y.o. very pleasant female patient who presents with the following:  Physical exam today- most recent visit with myself 8/21 She was seen by my partner Vernona Rieger more recently, this past spring for a sinus infection This is all cleared up   Her asthma has been under good control recently She broke her leg- fell off a step stool since I last saw her.  She is all better now   Colon cancer screening- I sent her cologuard but she never completed, would like me to order for her  Mammo- overdue Pap screening- overdue, recommended for her  Covid booster [facility] due for update   However it turns out patient's insurance has changed and we are now out of network for her.  She would like to find out more about her benefits prior to proceeding with any further testing or physical today.  We will no charge this visit today and she will find out more and reschedule with [place] or with a different provider if needed   Patient Active Problem List   Diagnosis Date Noted   Non-recurrent acute serous otitis media of both ears 02/17/2021   Bronchitis 02/17/2021   Tibial plateau fracture, unspecified laterality, closed, initial encounter 07/02/2020   Acute lateral meniscal tear 06/18/2020   Nasal polyp - anterior 01/02/2016   Tobacco use disorder 12/22/2015   Asthma, mild persistent 02/26/2015    Past Medical History:  Diagnosis Date   Allergy    Asthma    Environmental allergies    Plantar fasciitis    Bilateral   Seasonal allergies     Past Surgical History:  Procedure Laterality  Date   CESAREAN SECTION     FOOT SURGERY     Right   TONSILLECTOMY      Social History   Tobacco Use   Smoking status: Some Days    Packs/day: .1    Types: Cigarettes   Smokeless tobacco: Never   Tobacco comments:    1 cigarette per day  Substance Use Topics   Alcohol use: Yes    Alcohol/week: 8.0 standard drinks of alcohol    Types: 8 Standard drinks or equivalent per week   Drug use: No    Family History  Problem Relation Age of Onset   Macular degeneration Mother        Living   Vascular Disease Mother    Stroke Father 50       Deceased   Other Father        Borderline Diabetes   Lung cancer Maternal Uncle        #1   Heart attack Maternal Uncle        Deceased at 14, #2   Skin cancer Maternal Uncle        #1   HIV/AIDS Brother        #1   Healthy Brother        #2   Healthy Sister        x2   Heart defect Son  Bicuspid Valve   Healthy Son        #2   Healthy Daughter        x1   Allergies Son        #1    No Known Allergies  Medication list has been reviewed and updated.  Current Outpatient Medications on File Prior to Visit  Medication Sig Dispense Refill   albuterol (VENTOLIN HFA) 108 (90 Base) MCG/ACT inhaler INHALE 2 PUFFS BY MOUTH EVERY 6 HOURS AS NEEDED FOR COUGH OR WHEEZING 6.7 g 0   Albuterol Sulfate 2.5 MG/0.5ML NEBU Inhale 0.5 mLs into the lungs 4 (four) times daily as needed. 20 mL 1   azelastine (ASTELIN) 0.1 % nasal spray Place 1 spray into both nostrils 2 (two) times daily. Use in each nostril as directed 90 mL 3   budesonide-formoterol (SYMBICORT) 80-4.5 MCG/ACT inhaler Inhale 2 puffs into the lungs 2 (two) times daily. 3 each 3   cetirizine (ZYRTEC) 10 MG tablet TAKE 1 TABLET(10 MG) BY MOUTH DAILY AS NEEDED FOR ALLERGIES OR RHINITIS 90 tablet 1   fluticasone (FLONASE) 50 MCG/ACT nasal spray SHAKE LIQUID AND USE 1 TO 2 SPRAYS IN EACH NOSTRIL EVERY DAY 48 g 2   montelukast (SINGULAIR) 10 MG tablet Take 1 tablet (10 mg total) by  mouth at bedtime. 90 tablet 1   naproxen (NAPROSYN) 500 MG tablet TAKE 1 TABLET(500 MG) BY MOUTH TWICE DAILY WITH A MEAL 60 tablet 3   traMADol (ULTRAM) 50 MG tablet TAKE 1 TABLET BY MOUTH TWICE DAILY AS NEEDED FOR PAIN 30 tablet 0   No current facility-administered medications on file prior to visit.    Review of Systems:  As per HPI- otherwise negative.   Physical Examination: Vitals:   02/16/23 1320  BP: 110/62  Pulse: 61  Resp: 18  Temp: 98.2 F (36.8 C)  SpO2: 95%   There were no vitals filed for this visit. There is no height or weight on file to calculate BMI. Ideal Body Weight:   Not examined    Assessment and Plan: Screening for colon cancer - Plan: Cologuard  Appointment canceled by hospital  We decided to cancel appointment today as I am not in patient's insurance network any longer.  She will call them and find out more about her benefits.  I did go ahead and order a Cologuard as the ordering provider I do not think should matter.  Reminded her she is due for mammogram, Pap, labs, colon cancer screening  Signed Abbe Amsterdam, MD

## 2023-02-16 ENCOUNTER — Ambulatory Visit (INDEPENDENT_AMBULATORY_CARE_PROVIDER_SITE_OTHER): Payer: BLUE CROSS/BLUE SHIELD | Admitting: Family Medicine

## 2023-02-16 VITALS — BP 110/62 | HR 61 | Temp 98.2°F | Resp 18

## 2023-02-16 DIAGNOSIS — Z1211 Encounter for screening for malignant neoplasm of colon: Secondary | ICD-10-CM

## 2023-02-16 DIAGNOSIS — Z0000 Encounter for general adult medical examination without abnormal findings: Secondary | ICD-10-CM

## 2023-02-16 DIAGNOSIS — Z538 Procedure and treatment not carried out for other reasons: Secondary | ICD-10-CM

## 2023-02-21 ENCOUNTER — Other Ambulatory Visit: Payer: Self-pay | Admitting: Family Medicine

## 2023-02-21 DIAGNOSIS — J3089 Other allergic rhinitis: Secondary | ICD-10-CM

## 2023-03-05 ENCOUNTER — Other Ambulatory Visit: Payer: Self-pay | Admitting: Family Medicine

## 2023-03-05 DIAGNOSIS — J302 Other seasonal allergic rhinitis: Secondary | ICD-10-CM

## 2023-04-21 ENCOUNTER — Other Ambulatory Visit: Payer: Self-pay | Admitting: Family Medicine

## 2023-04-21 ENCOUNTER — Telehealth: Payer: Self-pay | Admitting: Family Medicine

## 2023-04-21 DIAGNOSIS — J4530 Mild persistent asthma, uncomplicated: Secondary | ICD-10-CM

## 2023-04-21 NOTE — Telephone Encounter (Signed)
Prescription Request  04/21/2023  Is this a "Controlled Substance" medicine? No  LOV: 02/16/2023  What is the name of the medication or equipment?   albuterol (VENTOLIN HFA) 108 (90 Base) MCG/ACT inhaler [[id number]]   fluticasone (FLONASE) 50 MCG/ACT nasal spray [[id number]]   budesonide-formoterol (SYMBICORT) 80-4.5 MCG/ACT inhaler [[id number]]   Have you contacted your pharmacy to request a refill? No   Which pharmacy would you like this sent to?   [id number] [place], [place] - [address] AT [facility] [address] [place] [address] Phone: [phone] Fax: [phone]  Patient notified that their request is being sent to the clinical staff for review and that they should receive a response within 2 business days.   Please advise at Mobile [phone] (mobile)

## 2023-04-22 ENCOUNTER — Other Ambulatory Visit: Payer: Self-pay

## 2023-04-22 DIAGNOSIS — J4530 Mild persistent asthma, uncomplicated: Secondary | ICD-10-CM

## 2023-04-22 DIAGNOSIS — J3089 Other allergic rhinitis: Secondary | ICD-10-CM

## 2023-04-22 DIAGNOSIS — J302 Other seasonal allergic rhinitis: Secondary | ICD-10-CM

## 2023-04-22 MED ORDER — FLUTICASONE PROPIONATE 50 MCG/ACT NA SUSP: NASAL | 2 refills | Status: AC

## 2023-04-22 MED ORDER — BUDESONIDE-FORMOTEROL FUMARATE 80-4.5 MCG/ACT IN AERO: 2.0000 | INHALATION_SPRAY | Freq: Two times a day (BID) | RESPIRATORY_TRACT | 3 refills | Status: AC

## 2023-04-22 MED ORDER — ALBUTEROL SULFATE HFA 108 (90 BASE) MCG/ACT IN AERS: 2.0000 | INHALATION_SPRAY | Freq: Four times a day (QID) | RESPIRATORY_TRACT | 2 refills | Status: DC | PRN

## 2023-04-22 NOTE — Telephone Encounter (Signed)
Refills sent

## 2023-06-09 ENCOUNTER — Other Ambulatory Visit: Payer: Self-pay | Admitting: Family Medicine

## 2023-06-09 DIAGNOSIS — J302 Other seasonal allergic rhinitis: Secondary | ICD-10-CM

## 2023-08-02 ENCOUNTER — Other Ambulatory Visit: Payer: Self-pay | Admitting: Family Medicine

## 2023-08-02 DIAGNOSIS — J302 Other seasonal allergic rhinitis: Secondary | ICD-10-CM

## 2023-08-02 DIAGNOSIS — J4530 Mild persistent asthma, uncomplicated: Secondary | ICD-10-CM

## 2023-09-02 ENCOUNTER — Other Ambulatory Visit: Payer: Self-pay | Admitting: Family Medicine

## 2023-09-02 DIAGNOSIS — J4530 Mild persistent asthma, uncomplicated: Secondary | ICD-10-CM

## 2024-09-19 ENCOUNTER — Encounter: Payer: Self-pay | Admitting: Family Medicine

## 2024-09-19 ENCOUNTER — Ambulatory Visit (INDEPENDENT_AMBULATORY_CARE_PROVIDER_SITE_OTHER): Admitting: Family Medicine

## 2024-09-19 VITALS — BP 118/70 | HR 73 | Temp 97.8°F | Resp 16 | Ht 61.0 in | Wt 121.8 lb

## 2024-09-19 DIAGNOSIS — J4541 Moderate persistent asthma with (acute) exacerbation: Secondary | ICD-10-CM | POA: Diagnosis not present

## 2024-09-19 MED ORDER — FLUCONAZOLE 150 MG PO TABS: ORAL_TABLET | ORAL | 0 refills | Status: AC

## 2024-09-19 MED ORDER — AZITHROMYCIN 250 MG PO TABS: ORAL_TABLET | ORAL | 0 refills | Status: AC

## 2024-09-19 MED ORDER — PREDNISONE 20 MG PO TABS: 40.0000 mg | ORAL_TABLET | Freq: Every day | ORAL | 0 refills | Status: AC

## 2024-09-19 NOTE — Patient Instructions (Signed)
 Continue to push fluids, practice good hand hygiene, and cover your mouth if you cough.  If you start having fevers, shaking or shortness of breath, seek immediate care.  Start with the prednisone . If not obviously better in 2 days, take the Zpak.   OK to take Tylenol  1000 mg (2 extra strength tabs) or 975 mg (3 regular strength tabs) every 6 hours as needed.  Neb/albuterol  treatments every 4 hours while awake.   Let us  know if you need anything.

## 2024-09-19 NOTE — Progress Notes (Signed)
 Chief Complaint  Patient presents with   Nasal Congestion    Nasal Congestion    Jane Spencer here for URI complaints.  Duration: 1 week  Associated symptoms: sinus headache, sinus congestion, rhinorrhea, sore throat, wheezing, shortness of breath, aches, and coughing, diarrhea Denies: sinus pain, rhinorrhea, itchy watery eyes, ear pain, ear drainage, and fevers Treatment to date: Nebulizer, Symbicort , INCS, Tylenol  Sick contacts: No  Past Medical History:  Diagnosis Date   Allergy    Asthma    Environmental allergies    Plantar fasciitis    Bilateral   Seasonal allergies     Objective BP 118/70 (BP Location: Left Arm, Patient Position: Sitting)   Pulse 73   Temp 97.8 F (36.6 C) (Oral)   Resp 16   Ht 5' 1 (1.549 m)   Wt 121 lb 12.8 oz (55.2 kg)   SpO2 98%   BMI 23.01 kg/m  General: Awake, alert, appears stated age HEENT: AT, [place], ears patent b/l and TM's neg, nares patent w/o discharge, pharynx pink and without exudates, MMM, no sinus ttp Neck: No masses or asymmetry Heart: RRR Lungs: diffuse exp wheezes, no accessory muscle use Psych: Age appropriate judgment and insight, normal mood and affect  Moderate persistent asthma with acute exacerbation - Plan: predniSONE  (DELTASONE ) 20 MG tablet  Exacerbation of chronic issue.  5-day prednisone  burst 40 mg daily.  Pocket prescription for Z-Pak provided should she fail to improve in the next 2 days.  Continue to push fluids, practice good hand hygiene, cover mouth when coughing. F/u prn. If starting to experience fevers, shaking, or worsening shortness of breath, seek immediate care. Pt voiced understanding and agreement to the plan.  Jane [place], DO 09/19/2024 9:53 AM
